# Patient Record
Sex: Male | Born: 1955 | Race: White | Hispanic: No | Marital: Married | Smoking: Former smoker
Health system: Southern US, Community
[De-identification: ages and names within clinical notes are randomized; demographics above are authoritative.]

## PROBLEM LIST (undated history)

## (undated) DIAGNOSIS — F988 Other specified behavioral and emotional disorders with onset usually occurring in childhood and adolescence: Secondary | ICD-10-CM

## (undated) DIAGNOSIS — F909 Attention-deficit hyperactivity disorder, unspecified type: Secondary | ICD-10-CM

## (undated) DIAGNOSIS — E119 Type 2 diabetes mellitus without complications: Secondary | ICD-10-CM

## (undated) DIAGNOSIS — H3320 Serous retinal detachment, unspecified eye: Secondary | ICD-10-CM

## (undated) DIAGNOSIS — E7800 Pure hypercholesterolemia, unspecified: Secondary | ICD-10-CM

## (undated) DIAGNOSIS — D649 Anemia, unspecified: Secondary | ICD-10-CM

## (undated) DIAGNOSIS — G629 Polyneuropathy, unspecified: Secondary | ICD-10-CM

## (undated) DIAGNOSIS — K219 Gastro-esophageal reflux disease without esophagitis: Secondary | ICD-10-CM

## (undated) DIAGNOSIS — M170 Bilateral primary osteoarthritis of knee: Secondary | ICD-10-CM

## (undated) DIAGNOSIS — C44619 Basal cell carcinoma of skin of left upper limb, including shoulder: Secondary | ICD-10-CM

## (undated) DIAGNOSIS — R413 Other amnesia: Secondary | ICD-10-CM

## (undated) DIAGNOSIS — G4730 Sleep apnea, unspecified: Secondary | ICD-10-CM

## (undated) DIAGNOSIS — K5792 Diverticulitis of intestine, part unspecified, without perforation or abscess without bleeding: Secondary | ICD-10-CM

## (undated) DIAGNOSIS — I10 Essential (primary) hypertension: Secondary | ICD-10-CM

## (undated) HISTORY — PX: ANKLE FRACTURE SURGERY: SHX122

## (undated) HISTORY — PX: SEPTOPLASTY: SUR1290

## (undated) HISTORY — PX: COLONOSCOPY: SHX174

## (undated) HISTORY — DX: Other amnesia: R41.3

## (undated) HISTORY — PX: FRACTURE SURGERY: SHX138

## (undated) HISTORY — PX: BASAL CELL CARCINOMA EXCISION: SHX1214

## (undated) HISTORY — PX: JOINT REPLACEMENT: SHX530

## (undated) HISTORY — DX: Other specified behavioral and emotional disorders with onset usually occurring in childhood and adolescence: F98.8

---

## 1998-12-26 ENCOUNTER — Ambulatory Visit: Admission: RE | Admit: 1998-12-26 | Discharge: 1998-12-26 | Payer: Self-pay | Admitting: Otolaryngology

## 2002-02-22 ENCOUNTER — Encounter (INDEPENDENT_AMBULATORY_CARE_PROVIDER_SITE_OTHER): Payer: Self-pay | Admitting: Specialist

## 2002-02-22 ENCOUNTER — Ambulatory Visit (HOSPITAL_COMMUNITY): Admission: RE | Admit: 2002-02-22 | Discharge: 2002-02-22 | Payer: Self-pay | Admitting: Gastroenterology

## 2003-11-28 ENCOUNTER — Ambulatory Visit (HOSPITAL_COMMUNITY): Admission: RE | Admit: 2003-11-28 | Discharge: 2003-11-28 | Payer: Self-pay | Admitting: Family Medicine

## 2003-12-28 ENCOUNTER — Encounter: Admission: RE | Admit: 2003-12-28 | Discharge: 2003-12-28 | Payer: Self-pay | Admitting: Orthopedic Surgery

## 2005-12-12 ENCOUNTER — Encounter (INDEPENDENT_AMBULATORY_CARE_PROVIDER_SITE_OTHER): Payer: Self-pay | Admitting: Specialist

## 2005-12-12 ENCOUNTER — Ambulatory Visit (HOSPITAL_BASED_OUTPATIENT_CLINIC_OR_DEPARTMENT_OTHER): Admission: RE | Admit: 2005-12-12 | Discharge: 2005-12-12 | Payer: Self-pay | Admitting: General Practice

## 2006-06-19 ENCOUNTER — Ambulatory Visit (HOSPITAL_BASED_OUTPATIENT_CLINIC_OR_DEPARTMENT_OTHER): Admission: RE | Admit: 2006-06-19 | Discharge: 2006-06-19 | Payer: Self-pay | Admitting: Surgery

## 2006-06-19 ENCOUNTER — Encounter (INDEPENDENT_AMBULATORY_CARE_PROVIDER_SITE_OTHER): Payer: Self-pay | Admitting: Specialist

## 2008-02-29 ENCOUNTER — Ambulatory Visit (HOSPITAL_COMMUNITY): Admission: RE | Admit: 2008-02-29 | Discharge: 2008-02-29 | Payer: Self-pay | Admitting: Orthopedic Surgery

## 2010-02-07 ENCOUNTER — Encounter: Admission: RE | Admit: 2010-02-07 | Discharge: 2010-05-08 | Payer: Self-pay | Admitting: Family Medicine

## 2010-07-19 ENCOUNTER — Ambulatory Visit: Payer: Self-pay | Admitting: Psychology

## 2011-04-05 NOTE — Procedures (Signed)
[place]  Patient:    Jeff Edwards, Jeff Edwards Visit Number: [id number] MRN: [id number]          Service Type: END Location: ENDO Attending Physician:  Rich Brave Dictated by:   Florencia Reasons, M.D. Proc. Date: 02/22/02 Admit Date:  02/22/2002 Discharge Date: 02/22/2002   CC:         Lillia Carmel, M.D.   Procedure Report  PROCEDURE:  Colonoscopy with biopsy.  INDICATION:  This is a 55 year old gentleman with a family history of colon cancer in his mother when she was in her 66s.  Colonoscopy about five years ago showed a hyperplastic polyp.  FINDINGS:  Sigmoid diverticulosis.  Two Louth polyps.  DESCRIPTION OF PROCEDURE:  The nature, purpose, and risks of the procedure were familiar to the patient from prior examination, and he provided written consent.  Sedation was fentanyl 100 mcg and Versed 10 mg IV without arrhythmias or desaturation.  Digital exam of the rectum was unremarkable. The Olympus adjustable-tension pediatric video colonoscope was advanced without too much difficulty to the cecum, and pullback was then performed. The cecum was identified by visualization of the appendiceal orifice.  We did have to apply some external abdominal compression and also turn the patient into the supine position to help control looping during advancement of the scope.  Pullback was performed.  The quality of the prep was very good, and it is felt that essentially all areas were well-seen.  There was a moderate amount of sigmoid diverticulosis.  In the ascending colon there was a 2-3 mm slightly erythematous sessile polyp removed by a couple of cold biopsies.  At 25 cm there was what appeared to be a Dugo sessile hyperplastic-appearing polyp, which I biopsied x1.  After closer inspection I wondered if it could possibly represent an inverted diverticulum, although after the biopsy the lesion seemed to conform into a more classic  semipedunculated polypoid configuration.  In any event, probing the lesion with the biopsy forceps did not cause it to evert into a nice, standard diverticulum, but rather led to some fold formation.  Given the uncertain character of this sigmoid polypoid lesion and given the fact that it had been biopsied once, I elected to leave it alone with respect to further evaluation or biopsies.  Reinspection of the rectosigmoid as well as retroflexion in the rectum was unremarkable.  The patient tolerated the procedure well, and there were no apparent complications.  IMPRESSION:  Roesler polyp in the ascending colon and probable Ferrick polyp at 25 cm, each biopsied.  PLAN:  Await pathology. Dictated by:   Florencia Reasons, M.D. Attending Physician:  Rich Brave DD:  02/22/02 TD:  02/23/02 Job: [id number]

## 2011-04-05 NOTE — Op Note (Signed)
NAME:  Jeff Edwards, Jeff Edwards               ACCOUNT NO.:  [id number]   MEDICAL RECORD NO.:  [id number]          PATIENT TYPE:  AMB   LOCATION:  DSC                          FACILITY:  [facility]   PHYSICIAN:  Currie Paris, M.D.DATE OF BIRTH:  12-24-1955   DATE OF PROCEDURE:  06/19/2006  DATE OF DISCHARGE:                                 OPERATIVE REPORT   PREOPERATIVE DIAGNOSIS:  Basal cell carcinoma, left antecubital fossa.   POSTOPERATIVE DIAGNOSIS:  Basal cell carcinoma, left antecubital fossa.   OPERATION:  Excision, basal cell carcinoma, antecubital fossa.   SURGEON:  Dr. Jamey Ripa.   ANESTHESIA:  Local.   CLINICAL HISTORY:  [name] has had a biopsy of the Gallicchio lesion in the  antecubital fossa which showed a basal cell carcinoma.  After evaluation by  a dermatologist, it appeared to be the only lesion, and we brought him to  the minor procedure room for wide excision.   DESCRIPTION OF PROCEDURE:  In the minor procedure room, the lesion was  identified.  We confirmed that with plans for removal of that under local.   The area was infiltrated 1% Xylocaine with epinephrine.  I waited about 10  minutes.  It was then prepped with Betadine.   I had good anesthesia.  I made an elliptical incision so that we got a 3- to  4-mm margin at a minimal in all directions.  The incision was made  vertically.  I was at the very lateral edge of the antecubital fossa.  I  then extended the incision at the top and bottom at an angle to get  something of a Z-plasty for closure so that we did not have a straight  incision going across the joint.  The incision was then closed with some  interrupted 3-0 Vicryl plus 5-0 Prolene.   The patient tolerated the procedure well.  There were no complications.      Currie Paris, M.D.  Electronically Signed     CJS/MEDQ  D:  06/20/2006  T:  06/20/2006  Job:  [id number]

## 2011-07-25 ENCOUNTER — Other Ambulatory Visit (HOSPITAL_COMMUNITY): Payer: Self-pay | Admitting: Orthopedic Surgery

## 2011-07-25 DIAGNOSIS — M2390 Unspecified internal derangement of unspecified knee: Secondary | ICD-10-CM

## 2011-08-05 ENCOUNTER — Ambulatory Visit (HOSPITAL_COMMUNITY)
Admission: RE | Admit: 2011-08-05 | Discharge: 2011-08-05 | Disposition: A | Discharge status: Home / Self Care | Payer: 59 | Source: Ambulatory Visit | Attending: Orthopedic Surgery | Admitting: Orthopedic Surgery

## 2011-08-05 DIAGNOSIS — M2390 Unspecified internal derangement of unspecified knee: Secondary | ICD-10-CM

## 2011-08-05 DIAGNOSIS — IMO0002 Reserved for concepts with insufficient information to code with codable children: Secondary | ICD-10-CM | POA: Insufficient documentation

## 2011-08-05 DIAGNOSIS — X58XXXA Exposure to other specified factors, initial encounter: Secondary | ICD-10-CM | POA: Insufficient documentation

## 2011-08-05 DIAGNOSIS — M25569 Pain in unspecified knee: Secondary | ICD-10-CM | POA: Insufficient documentation

## 2012-05-26 ENCOUNTER — Other Ambulatory Visit (HOSPITAL_COMMUNITY): Payer: Self-pay | Admitting: Orthopedic Surgery

## 2012-06-29 ENCOUNTER — Encounter (HOSPITAL_COMMUNITY)
Admission: RE | Admit: 2012-06-29 | Discharge: 2012-06-29 | Disposition: A | Discharge status: Home / Self Care | Payer: 59 | Source: Ambulatory Visit | Attending: Orthopedic Surgery | Admitting: Orthopedic Surgery

## 2012-06-29 ENCOUNTER — Encounter (HOSPITAL_COMMUNITY): Payer: Self-pay

## 2012-06-29 ENCOUNTER — Encounter (HOSPITAL_COMMUNITY): Payer: Self-pay | Admitting: Pharmacy Technician

## 2012-06-29 ENCOUNTER — Other Ambulatory Visit: Payer: Self-pay | Admitting: Nephrology

## 2012-06-29 DIAGNOSIS — I10 Essential (primary) hypertension: Secondary | ICD-10-CM

## 2012-06-29 HISTORY — DX: Sleep apnea, unspecified: G47.30

## 2012-06-29 HISTORY — DX: Attention-deficit hyperactivity disorder, unspecified type: F90.9

## 2012-06-29 HISTORY — DX: Essential (primary) hypertension: I10

## 2012-06-29 HISTORY — DX: Gastro-esophageal reflux disease without esophagitis: K21.9

## 2012-06-29 LAB — CBC
HCT: 42.3 % (ref 39.0–52.0)
RDW: 13.4 % (ref 11.5–15.5)
WBC: 9.4 10*3/uL (ref 4.0–10.5)

## 2012-06-29 LAB — COMPREHENSIVE METABOLIC PANEL
ALT: 17 U/L (ref 0–53)
AST: 13 U/L (ref 0–37)
Albumin: 3.7 g/dL (ref 3.5–5.2)
Alkaline Phosphatase: 53 U/L (ref 39–117)
BUN: 14 mg/dL (ref 6–23)
Chloride: 103 mEq/L (ref 96–112)
Potassium: 3.9 mEq/L (ref 3.5–5.1)
Sodium: 142 mEq/L (ref 135–145)
Total Bilirubin: 0.3 mg/dL (ref 0.3–1.2)
Total Protein: 6.9 g/dL (ref 6.0–8.3)

## 2012-06-29 LAB — PROTIME-INR: Prothrombin Time: 14 seconds (ref 11.6–15.2)

## 2012-06-29 LAB — SURGICAL PCR SCREEN: Staphylococcus aureus: NEGATIVE

## 2012-06-29 LAB — APTT: aPTT: 31 seconds (ref 24–37)

## 2012-06-29 NOTE — Pre-Procedure Instructions (Addendum)
[address] Jeff Edwards  06/29/2012   Your procedure is scheduled on:  Wednesday July 08, 2012  Report to [facility] Short Stay [facility] at 9:30 AM.  Call this number if you have problems the morning of surgery: [phone]   Remember:   Do not eat food or drink:After Midnight.      Take these medicines the morning of surgery with A SIP OF WATER: prilosec, methylphenidate, hydrocodone    Do not wear jewelry, make-up or nail polish.  Do not wear lotions, powders, or perfumes. You may wear deodorant.  Do not shave 48 hours prior to surgery. Men may shave face and neck.  Do not bring valuables to the hospital.  Contacts, dentures or bridgework may not be worn into surgery.  Leave suitcase in the car. After surgery it may be brought to your room.  For patients admitted to the hospital, checkout time is 11:00 AM the day of discharge.   Patients discharged the day of surgery will not be allowed to drive home.  Name and phone number of your driver:  Special Instructions: CHG Shower Use Special Wash: 1/2 bottle night before surgery and 1/2 bottle morning of surgery.   Please read over the following fact sheets that you were given: Pain Booklet, Coughing and Deep Breathing, MRSA Information and Surgical Site Infection Prevention

## 2012-07-09 MED ORDER — CLINDAMYCIN PHOSPHATE 900 MG/50ML IV SOLN
900.0000 mg | INTRAVENOUS | Status: AC
  Administered 2012-07-10: 900 mg via INTRAVENOUS
  Filled 2012-07-09: qty 50

## 2012-07-09 NOTE — Progress Notes (Signed)
Pt returned call to [facility]..he will arrive at 0530 due to time change.. On 07/10/2012

## 2012-07-09 NOTE — Progress Notes (Addendum)
Left message for patient to arrive at 0530 for surgery 07/10/12. I did leave a message for patient to call back so we would know he got the message. The patient did have his name on message.

## 2012-07-10 ENCOUNTER — Encounter (HOSPITAL_COMMUNITY): Payer: Self-pay | Admitting: Certified Registered"

## 2012-07-10 ENCOUNTER — Ambulatory Visit (HOSPITAL_COMMUNITY)
Admission: RE | Admit: 2012-07-10 | Discharge: 2012-07-10 | Disposition: A | Discharge status: Home / Self Care | Payer: 59 | Source: Ambulatory Visit | Attending: Orthopedic Surgery | Admitting: Orthopedic Surgery

## 2012-07-10 ENCOUNTER — Encounter (HOSPITAL_COMMUNITY)
Admission: RE | Disposition: A | Discharge status: Home / Self Care | Payer: Self-pay | Source: Ambulatory Visit | Attending: Orthopedic Surgery

## 2012-07-10 ENCOUNTER — Encounter (HOSPITAL_COMMUNITY): Payer: Self-pay | Admitting: *Deleted

## 2012-07-10 ENCOUNTER — Ambulatory Visit (HOSPITAL_COMMUNITY): Payer: 59 | Admitting: Certified Registered"

## 2012-07-10 DIAGNOSIS — K219 Gastro-esophageal reflux disease without esophagitis: Secondary | ICD-10-CM | POA: Insufficient documentation

## 2012-07-10 DIAGNOSIS — I10 Essential (primary) hypertension: Secondary | ICD-10-CM | POA: Insufficient documentation

## 2012-07-10 DIAGNOSIS — Z01812 Encounter for preprocedural laboratory examination: Secondary | ICD-10-CM | POA: Insufficient documentation

## 2012-07-10 DIAGNOSIS — M1710 Unilateral primary osteoarthritis, unspecified knee: Secondary | ICD-10-CM

## 2012-07-10 DIAGNOSIS — G4730 Sleep apnea, unspecified: Secondary | ICD-10-CM | POA: Insufficient documentation

## 2012-07-10 DIAGNOSIS — E119 Type 2 diabetes mellitus without complications: Secondary | ICD-10-CM | POA: Insufficient documentation

## 2012-07-10 DIAGNOSIS — S83289A Other tear of lateral meniscus, current injury, unspecified knee, initial encounter: Secondary | ICD-10-CM

## 2012-07-10 DIAGNOSIS — M659 Unspecified synovitis and tenosynovitis, unspecified site: Secondary | ICD-10-CM | POA: Insufficient documentation

## 2012-07-10 DIAGNOSIS — M23302 Other meniscus derangements, unspecified lateral meniscus, unspecified knee: Secondary | ICD-10-CM | POA: Insufficient documentation

## 2012-07-10 DIAGNOSIS — M23305 Other meniscus derangements, unspecified medial meniscus, unspecified knee: Secondary | ICD-10-CM | POA: Insufficient documentation

## 2012-07-10 DIAGNOSIS — Z01810 Encounter for preprocedural cardiovascular examination: Secondary | ICD-10-CM | POA: Insufficient documentation

## 2012-07-10 HISTORY — PX: KNEE ARTHROSCOPY: SHX127

## 2012-07-10 LAB — GLUCOSE, CAPILLARY
Glucose-Capillary: 100 mg/dL — ABNORMAL HIGH (ref 70–99)
Glucose-Capillary: 96 mg/dL (ref 70–99)

## 2012-07-10 SURGERY — ARTHROSCOPY, KNEE
Anesthesia: General | Site: Knee | Laterality: Right | Wound class: Clean

## 2012-07-10 MED ORDER — SODIUM CHLORIDE 0.9 % IR SOLN
Status: DC | PRN
  Administered 2012-07-10 (×2): 3000 mL

## 2012-07-10 MED ORDER — HYDROCODONE-ACETAMINOPHEN 5-500 MG PO TABS: 1.0000 | ORAL_TABLET | Freq: Four times a day (QID) | ORAL | Status: AC | PRN

## 2012-07-10 MED ORDER — LACTATED RINGERS IV SOLN
INTRAVENOUS | Status: DC | PRN
  Administered 2012-07-10: 07:00:00 via INTRAVENOUS

## 2012-07-10 MED ORDER — MIDAZOLAM HCL 5 MG/5ML IJ SOLN
INTRAMUSCULAR | Status: DC | PRN
  Administered 2012-07-10: 2 mg via INTRAVENOUS

## 2012-07-10 MED ORDER — ROPIVACAINE HCL 5 MG/ML IJ SOLN
INTRAMUSCULAR | Status: DC | PRN
  Administered 2012-07-10: 15 mL via EPIDURAL

## 2012-07-10 MED ORDER — LIDOCAINE-EPINEPHRINE (PF) 1.5 %-1:200000 IJ SOLN
INTRAMUSCULAR | Status: DC | PRN
  Administered 2012-07-10: 15 mL

## 2012-07-10 MED ORDER — HYDROMORPHONE HCL PF 1 MG/ML IJ SOLN: 0.2500 mg | INTRAMUSCULAR | Status: DC | PRN

## 2012-07-10 MED ORDER — MEPERIDINE HCL 25 MG/ML IJ SOLN: 6.2500 mg | INTRAMUSCULAR | Status: DC | PRN

## 2012-07-10 MED ORDER — BUPIVACAINE HCL (PF) 0.25 % IJ SOLN
INTRAMUSCULAR | Status: DC | PRN
  Administered 2012-07-10: 30 mL

## 2012-07-10 MED ORDER — FENTANYL CITRATE 0.05 MG/ML IJ SOLN
INTRAMUSCULAR | Status: DC | PRN
  Administered 2012-07-10: 50 ug via INTRAVENOUS
  Administered 2012-07-10: 100 ug via INTRAVENOUS

## 2012-07-10 MED ORDER — BUPIVACAINE HCL (PF) 0.25 % IJ SOLN
INTRAMUSCULAR | Status: AC
  Filled 2012-07-10: qty 30

## 2012-07-10 MED ORDER — PROMETHAZINE HCL 25 MG/ML IJ SOLN: 6.2500 mg | INTRAMUSCULAR | Status: DC | PRN

## 2012-07-10 MED ORDER — OXYCODONE-ACETAMINOPHEN 5-325 MG PO TABS: 1.0000 | ORAL_TABLET | ORAL | Status: AC | PRN

## 2012-07-10 MED ORDER — PROPOFOL 10 MG/ML IV EMUL
INTRAVENOUS | Status: DC | PRN
  Administered 2012-07-10: 100 ug/kg/min via INTRAVENOUS

## 2012-07-10 SURGICAL SUPPLY — 37 items
BLADE CUDA 5.5 (BLADE) IMPLANT
BLADE GREAT WHITE 4.2 (BLADE) IMPLANT
BNDG COHESIVE 6X5 TAN STRL LF (GAUZE/BANDAGES/DRESSINGS) ×2 IMPLANT
BUR OVAL 6.0 (BURR) IMPLANT
CLOTH BEACON ORANGE TIMEOUT ST (SAFETY) ×2 IMPLANT
COVER SURGICAL LIGHT HANDLE (MISCELLANEOUS) ×2 IMPLANT
CUFF TOURNIQUET SINGLE 34IN LL (TOURNIQUET CUFF) IMPLANT
CUFF TOURNIQUET SINGLE 44IN (TOURNIQUET CUFF) IMPLANT
DRAPE ARTHROSCOPY W/POUCH 114 (DRAPES) ×2 IMPLANT
DRAPE U-SHAPE 47X51 STRL (DRAPES) ×2 IMPLANT
DRESSING ADAPTIC 1/2  N-ADH (PACKING) ×1 IMPLANT
DRSG EMULSION OIL 3X3 NADH (GAUZE/BANDAGES/DRESSINGS) ×2 IMPLANT
DRSG PAD ABDOMINAL 8X10 ST (GAUZE/BANDAGES/DRESSINGS) ×2 IMPLANT
DURAPREP 26ML APPLICATOR (WOUND CARE) ×2 IMPLANT
GLOVE SURG ORTHO 9.0 STRL STRW (GLOVE) ×2 IMPLANT
GOWN PREVENTION PLUS XLARGE (GOWN DISPOSABLE) ×2 IMPLANT
GOWN SRG XL XLNG 56XLVL 4 (GOWN DISPOSABLE) ×2 IMPLANT
GOWN STRL NON-REIN XL XLG LVL4 (GOWN DISPOSABLE) ×4
KIT BASIN OR (CUSTOM PROCEDURE TRAY) ×2 IMPLANT
KIT ROOM TURNOVER OR (KITS) ×2 IMPLANT
MANIFOLD NEPTUNE II (INSTRUMENTS) ×2 IMPLANT
NDL 18GX1X1/2 (RX/OR ONLY) (NEEDLE) ×1 IMPLANT
NEEDLE 18GX1X1/2 (RX/OR ONLY) (NEEDLE) ×2 IMPLANT
PACK ARTHROSCOPY DSU (CUSTOM PROCEDURE TRAY) ×2 IMPLANT
PAD ARMBOARD 7.5X6 YLW CONV (MISCELLANEOUS) ×4 IMPLANT
PADDING CAST ABS 6INX4YD NS (CAST SUPPLIES) ×1
PADDING CAST ABS COTTON 6X4 NS (CAST SUPPLIES) IMPLANT
PADDING CAST COTTON 6X4 STRL (CAST SUPPLIES) ×2 IMPLANT
SET ARTHROSCOPY TUBING (MISCELLANEOUS) ×2
SET ARTHROSCOPY TUBING LN (MISCELLANEOUS) ×1 IMPLANT
SPONGE GAUZE 4X4 12PLY (GAUZE/BANDAGES/DRESSINGS) ×2 IMPLANT
SUT ETHILON 4 0 PS 2 18 (SUTURE) ×2 IMPLANT
SYR 20CC LL (SYRINGE) ×2 IMPLANT
TOWEL OR 17X24 6PK STRL BLUE (TOWEL DISPOSABLE) ×4 IMPLANT
TUBE CONNECTING 12X1/4 (SUCTIONS) ×2 IMPLANT
WAND 90 DEG TURBOVAC W/CORD (SURGICAL WAND) ×1 IMPLANT
WATER STERILE IRR 1000ML POUR (IV SOLUTION) ×2 IMPLANT

## 2012-07-10 NOTE — Progress Notes (Signed)
cbg 96 

## 2012-07-10 NOTE — H&P (Signed)
Jeff Edwards is an 56 y.o. male.   Chief Complaint:  Osteoarthritis with mechanical symptoms right knee HPI: Patient is a 56 year old gentleman who has had pain with activities daily living with his right knee with catching locking giving way.  Past Medical History  Diagnosis Date  . Hypertension   . Diabetes mellitus   . ADHD (attention deficit hyperactivity disorder)   . Sleep apnea     cpap does not use (study done 10+ yrs)  . GERD (gastroesophageal reflux disease)   . Cancer     basal cell carcinoma     Past Surgical History  Procedure Date  . Basal cell carcinoma excision   . Ankle fracture surgery     broken and reset   age 8s    No family history on file. Social History:  reports that he has never smoked. He does not have any smokeless tobacco history on file. He reports that he does not drink alcohol or use illicit drugs.  Allergies:  Allergies  Allergen Reactions  . Penicillins Other (See Comments)    Passed out as a child    Medications Prior to Admission  Medication Sig Dispense Refill  . aspirin EC 81 MG tablet Take 81 mg by mouth daily.      . B Complex-C (SUPER B COMPLEX PO) Take 1 tablet by mouth daily.      . cetirizine (ZYRTEC) 10 MG tablet Take 10 mg by mouth every other day.      Marland Kitchen HYDROcodone-acetaminophen (NORCO/VICODIN) 5-325 MG per tablet Take 1 tablet by mouth every 6 (six) hours as needed. For pain      . lisinopril (PRINIVIL,ZESTRIL) 5 MG tablet Take 5 mg by mouth daily.      . metFORMIN (GLUCOPHAGE) 500 MG tablet Take 500 mg by mouth 2 (two) times daily with a meal.      . methylphenidate (RITALIN) 5 MG tablet Take 5 mg by mouth 2 (two) times daily.      . Multiple Vitamin (MULTIVITAMIN WITH MINERALS) TABS Take 1 tablet by mouth daily.      Marland Kitchen omeprazole (PRILOSEC) 40 MG capsule Take 40 mg by mouth daily.      . simvastatin (ZOCOR) 20 MG tablet Take 20 mg by mouth every evening.        No results found for this or any previous visit (from the  past 48 hour(s)). No results found.  Review of Systems  All other systems reviewed and are negative.    There were no vitals taken for this visit. Physical Exam  On examination patient has tenderness to palpation of the medial and lateral joint lines collaterals and cruciates are stable.  Assessment/Plan Assessment osteoarthritis right knee with mechanical symptoms. Plan: Will plan for right knee arthroscopy with debridement. Risks and benefits were discussed including infection neurovascular injury persistent pain need for additional surgery. Patient states he understands and wished to proceed at this time.  Erickson Yamashiro V 07/10/2012, 6:13 AM

## 2012-07-10 NOTE — Op Note (Signed)
OPERATIVE REPORT  DATE OF SURGERY: 07/10/2012  PATIENT:  Jeff Edwards,  56 y.o. male  PRE-OPERATIVE DIAGNOSIS:  Medial meniscal tear  POST-OPERATIVE DIAGNOSIS:  Medial meniscal tear, lateral meniscal tear. #2 osteochondral defect medial femoral condyle trochlea and patella  PROCEDURE:  Procedure(s): ARTHROSCOPY KNEE. Partial medial and lateral meniscectomy. Abrasion chondroplasty medial femoral condyle patella and trochlea. Removal of loose bodies greater than 10 mm in diameter.  SURGEON:  Surgeon(s): Nadara Mustard, MD  ANESTHESIA:   regional  EBL:  Minimal ML  SPECIMEN:  No Specimen  TOURNIQUET:  * No tourniquets in log *  PROCEDURE DETAILS: Patient is a 56 year old gentleman with mechanical symptoms of his right knee. He has failed conservative treatment he has had temporary relief with steroid injections has mechanical symptoms with catching locking giving riding presents at this time for arthroscopic intervention. Risks and benefits were discussed including infection neurovascular injury persistent pain DVT need for additional surgery. Patient states he understands and wished to proceed at this time. Description of procedure patient brought to the OR after undergoing a knee block. After adequate levels of anesthesia were obtained patient's right lower extremity was prepped using DuraPrep and draped into a sterile field. The scope was inserted from the anterior lateral portal and anterior medial working portal was established. Visualization showed a large degenerative radial tear posterior lateral corner of the medial meniscus. The shaver and vapor wand were used for debridement. Patient had large loose bodies from the osteochondral defect of medial femoral condyle and the medial femoral condyle was debrided with the shaver and the ring curette back to bleeding viable subchondral bone and the loose bodies were removed. Examination notch showed an intact anterior cruciate ligament and  PCL. Examination the lateral joint line and a figure 4 position showed a degenerative tear along the edge of the lateral meniscus and this was debrided with the shaver and vapor wand. The articular cartilage was in good condition the lateral joint line. Patient has significant evidence synovitis and a synovectomy was performed. Examination of patellofemoral joint also showed loose bodies from the trochlea and this was debrided with the shaver. Patient enlargement medial plica which was debrided. The vapor wand was used for hemostasis. A survey of the medial lateral gutters as well as the suprapatellar pouch and mediolateral joint line showed no further loose bodies. The isthmus removed the portals were closed using 4-0 nylon. The wound was covered with Adaptic orthopedic sponges AB dressing Kerlix and Coban. The joint was infused a total of 20 cc of quarter percent Marcaine plain. Patient was taken to the PACU in stable condition.  PLAN OF CARE: Discharge to home after PACU  PATIENT DISPOSITION:  PACU - hemodynamically stable.   Nadara Mustard, MD 07/10/2012 8:38 AM

## 2012-07-10 NOTE — Anesthesia Postprocedure Evaluation (Signed)
  Anesthesia Post-op Note  Patient: Jeff Edwards  Procedure(s) Performed: Procedure(s) (LRB): ARTHROSCOPY KNEE (Right)  Patient Location: PACU  Anesthesia Type: Regional  Level of Consciousness: awake  Airway and Oxygen Therapy: Patient Spontanous Breathing  Post-op Pain: none  Post-op Assessment: Post-op Vital signs reviewed  Post-op Vital Signs: stable  Complications: No apparent anesthesia complications

## 2012-07-10 NOTE — Progress Notes (Signed)
Orthopedic Tech Progress Note Patient Details:  Jeff Edwards Feb 16, 1956 [id number] Crutches delivered and set, patient unable to get up at this time for proper fitting. Instruction given. Ortho Devices Type of Ortho Device: Crutches Ortho Device/Splint Location: Crutches given Ortho Device/Splint Interventions: Application   Asia R Thompson 07/10/2012, 9:42 AM

## 2012-07-10 NOTE — Anesthesia Preprocedure Evaluation (Addendum)
Anesthesia Evaluation  Patient identified by MRN, date of birth, ID band Patient awake    Reviewed: Allergy & Precautions, H&P , NPO status , Patient's Chart, lab work & pertinent test results  Airway Mallampati: II TM Distance: >3 FB Neck ROM: Full    Dental  (+) Teeth Intact   Pulmonary sleep apnea ,  breath sounds clear to auscultation        Cardiovascular hypertension, Pt. on medications Rhythm:Regular Rate:Normal     Neuro/Psych    GI/Hepatic   Endo/Other  Well Controlled, Type 2, Oral Hypoglycemic Agents  Renal/GU      Musculoskeletal   Abdominal   Peds  Hematology   Anesthesia Other Findings   Reproductive/Obstetrics                          Anesthesia Physical Anesthesia Plan  ASA: II  Anesthesia Plan: Regional   Post-op Pain Management:    Induction: Intravenous  Airway Management Planned: Simple Face Mask  Additional Equipment:   Intra-op Plan:   Post-operative Plan:   Informed Consent: I have reviewed the patients History and Physical, chart, labs and discussed the procedure including the risks, benefits and alternatives for the proposed anesthesia with the patient or authorized representative who has indicated his/her understanding and acceptance.   Dental advisory given  Plan Discussed with: CRNA, Surgeon and Anesthesiologist  Anesthesia Plan Comments:        Anesthesia Quick Evaluation

## 2012-07-10 NOTE — Anesthesia Procedure Notes (Signed)
Anesthesia Regional Block:    Pre-Anesthetic Checklist: ,, timeout performed, Correct Patient, Correct Site, Correct Laterality, Correct Procedure, Correct Position, site marked, Risks and benefits discussed, Surgical consent,  Laterality: Right and Upper  Prep: Dura Prep        Narrative:  Start time: 07/10/2012 6:50 AM  Performed by: Personally  Anesthesiologist: T Evalise Abruzzese  Additional Notes: Knee Block performed, sterile prep and drape. 2 inferior portals blocked, for knee scope. Tolerated well

## 2012-07-10 NOTE — Preoperative (Signed)
Beta Blockers   Reason not to administer Beta Blockers:Not Applicable 

## 2012-07-10 NOTE — Transfer of Care (Signed)
Immediate Anesthesia Transfer of Care Note  Patient: Jeff Edwards  Procedure(s) Performed: Procedure(s) (LRB): ARTHROSCOPY KNEE (Right)  Patient Location: PACU  Anesthesia Type: MAC  Level of Consciousness: awake, alert  and oriented  Airway & Oxygen Therapy: Patient Spontanous Breathing and Patient connected to nasal cannula oxygen  Post-op Assessment: Report given to PACU RN, Post -op Vital signs reviewed and stable and Patient moving all extremities  Post vital signs: Reviewed and stable  Complications: No apparent anesthesia complications

## 2012-07-14 ENCOUNTER — Encounter (HOSPITAL_COMMUNITY): Payer: Self-pay | Admitting: Orthopedic Surgery

## 2012-07-16 ENCOUNTER — Encounter (HOSPITAL_COMMUNITY): Payer: Self-pay

## 2012-08-05 ENCOUNTER — Ambulatory Visit
Admission: RE | Admit: 2012-08-05 | Discharge: 2012-08-05 | Disposition: A | Discharge status: Home / Self Care | Payer: 59 | Source: Ambulatory Visit | Attending: Nephrology | Admitting: Nephrology

## 2012-08-05 DIAGNOSIS — I10 Essential (primary) hypertension: Secondary | ICD-10-CM

## 2012-08-17 ENCOUNTER — Other Ambulatory Visit: Payer: Self-pay | Admitting: Gastroenterology

## 2012-11-16 ENCOUNTER — Ambulatory Visit (INDEPENDENT_AMBULATORY_CARE_PROVIDER_SITE_OTHER): Payer: Self-pay | Admitting: Family Medicine

## 2012-11-16 DIAGNOSIS — E119 Type 2 diabetes mellitus without complications: Secondary | ICD-10-CM

## 2012-11-16 NOTE — Progress Notes (Signed)
Patient presents today for 3 month DM follow-up as part of the employer-sponsored Link to Wellness program. Medications and glucose readings have been reviewed. I have also discussed with patient lifestyle interventions such as healthy eating choices and physical activity. Details of this visit can be found in [facility] documenting program available through [facility]). Patient has set a series of personal goals and will follow-up in 3 months for further review of DM.

## 2012-11-25 ENCOUNTER — Encounter: Payer: Self-pay | Admitting: Family Medicine

## 2012-11-25 NOTE — Progress Notes (Signed)
Patient ID: Jeff Edwards, male   DOB: 1955/12/07, 57 y.o.   MRN: [id number] Reviewed: Agree with documentation and management.

## 2012-12-21 ENCOUNTER — Encounter (INDEPENDENT_AMBULATORY_CARE_PROVIDER_SITE_OTHER): Payer: 59 | Admitting: Ophthalmology

## 2012-12-21 DIAGNOSIS — H35719 Central serous chorioretinopathy, unspecified eye: Secondary | ICD-10-CM

## 2012-12-21 DIAGNOSIS — H35039 Hypertensive retinopathy, unspecified eye: Secondary | ICD-10-CM

## 2012-12-21 DIAGNOSIS — H2510 Age-related nuclear cataract, unspecified eye: Secondary | ICD-10-CM

## 2012-12-21 DIAGNOSIS — H43819 Vitreous degeneration, unspecified eye: Secondary | ICD-10-CM

## 2012-12-21 DIAGNOSIS — I10 Essential (primary) hypertension: Secondary | ICD-10-CM

## 2013-01-18 ENCOUNTER — Encounter (INDEPENDENT_AMBULATORY_CARE_PROVIDER_SITE_OTHER): Payer: 59 | Admitting: Ophthalmology

## 2013-01-18 DIAGNOSIS — H3530 Unspecified macular degeneration: Secondary | ICD-10-CM

## 2013-01-18 DIAGNOSIS — H35729 Serous detachment of retinal pigment epithelium, unspecified eye: Secondary | ICD-10-CM

## 2013-01-18 DIAGNOSIS — H35719 Central serous chorioretinopathy, unspecified eye: Secondary | ICD-10-CM

## 2013-01-18 DIAGNOSIS — H43819 Vitreous degeneration, unspecified eye: Secondary | ICD-10-CM

## 2013-01-18 DIAGNOSIS — H2510 Age-related nuclear cataract, unspecified eye: Secondary | ICD-10-CM

## 2013-03-01 ENCOUNTER — Ambulatory Visit (INDEPENDENT_AMBULATORY_CARE_PROVIDER_SITE_OTHER): Payer: Self-pay | Admitting: Family Medicine

## 2013-03-01 VITALS — BP 131/88 | HR 79 | Ht 70.0 in | Wt 229.8 lb

## 2013-03-01 DIAGNOSIS — E119 Type 2 diabetes mellitus without complications: Secondary | ICD-10-CM

## 2013-03-01 NOTE — Progress Notes (Signed)
Subjective:  Patient presents for a 3 month follow up appointment for an employer sponsored wellness program, [employer] to Wellness. 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 He states that he started seeing an opthalmologist and his vision has now returned. The cause of the detachment is unknown.   His weight is down about 10 pounds since his last appointment. He attributes this to increased physical activity.   He has no pending appointments with his PCP. At his last visit we checked his A1C and it was 6.7%. He declined an A1C check today.    Disease Assessments:  Diabetes:  Type of Diabetes: Type 2; Year of diagnosis 2010; Sees Diabetes provider 1 time a year; MD managing Diabetes Dr. Prince Rome;  checks feet daily; uses glucometer; takes medications as prescribed; takes an aspirin a day;  hypoglycemia frequency rarely; checks blood glucose once daily;  7 day CBG average 111; 14 day CBG average 121; 30 day CBG average 119; Lowest CBG 83; Highest CBG 169;   Other Diabetes History: He is checking his blood sugar once daily before his afternoon meal (this is fasting since he works third shift). Sometimes when he works he checks twice daily.   He recently had an URTI and his blood sugars were elevated.      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 Outside of work he is walking 3-4 days a week for 2 miles and it takes him 45 minutes. He is also doing more yard work now that the weather has improved. He states that as the  weather warms he will start walking in the morning.   Nutrition- He states that this is getting better. He previously was eating prepackaged meals with lunch. When he got home his BG was in the 80s-90s and he would eat a bowl of cereal. Now he is bringing his own lunch to work and his blood sugar is slightly higher- in the 100s-110s and he is not eating the cereal. He works third shift and he is eating at 5 PM dinner, 1 AM lunch, he is sometimes eating breakfast after he gets off, but not always. He states he is not hungry when he gets home but he is tired. He will often have a Mcclard meal/snack when he wakes up in the afternoon.   24 hour recall- patient is a vegetarian.   Breakfast- bowl of cereal, Honey Bunches of Oats with almond milk;   Lunch-diet pepsi, handful of chex mix  Dinner- black bean burger, cheddar cheese and french fries.   He states that he is still snacking on sweets and salty foods when he is working. He is trying to cut back so that he just has 1 serving daily.   Assessment/Plan: Patient is a 57 year old male with diabetes who is here for a 3 month follow up appointment. Since his last visit with me he has lost 10 pounds which was one of his goals from his last appointment. He attributes this weight loss to increased physical activity.  I congratulated patient on his accomplishment and encouraged him to continue losing weight. He wants to lose more weight. I advised him to increase physical activity and to decrease his intake of sugary snacks while he is working. Apparently his wife has been buying more doughnuts from a new shop that opened and he has been bringing one to work each night.   Patient is taking an ACE-I, ASA and a statin. Patient is interested in switching to pantoprazole due to the zero copay. I will fax Dr. Raye Sorrow office for the change.   CBGs from his meter are slightly elevated compared to his last visit, but this is likely secondary to his illness. No  hypoglycemia noted and most CBGs are at goal around 100-120.   Because his last A1C was at goal of less than 7%, I deferred A1C testing to next visit. Will follow up with patient in 3 months.    Teaching Goals:  Diabetes Mellitus:  Other Goals for Next Visit-  1. Continue walking four days a week- goal of 150 minutes weekly. 2. On the days that you work, swap out your sugary snack for a piece of sugar free candy or a piece of fruit.   Next appointment is Friday July 18th at 8 AM.

## 2013-03-11 NOTE — Progress Notes (Signed)
Patient ID: Jeff Edwards, male   DOB: 1956-07-16, 57 y.o.   MRN: [id number] ATTENDING PHYSICIAN NOTE: I have reviewed the chart and agree with the plan as detailed above. Denny Levy MD Pager [phone]

## 2013-03-29 ENCOUNTER — Encounter (INDEPENDENT_AMBULATORY_CARE_PROVIDER_SITE_OTHER): Payer: Commercial Managed Care - PPO | Admitting: Ophthalmology

## 2013-03-29 DIAGNOSIS — H3530 Unspecified macular degeneration: Secondary | ICD-10-CM

## 2013-03-29 DIAGNOSIS — H2510 Age-related nuclear cataract, unspecified eye: Secondary | ICD-10-CM

## 2013-03-29 DIAGNOSIS — H35039 Hypertensive retinopathy, unspecified eye: Secondary | ICD-10-CM

## 2013-03-29 DIAGNOSIS — H43819 Vitreous degeneration, unspecified eye: Secondary | ICD-10-CM

## 2013-03-29 DIAGNOSIS — I10 Essential (primary) hypertension: Secondary | ICD-10-CM

## 2013-03-29 DIAGNOSIS — H35719 Central serous chorioretinopathy, unspecified eye: Secondary | ICD-10-CM

## 2013-04-06 ENCOUNTER — Ambulatory Visit: Payer: Commercial Managed Care - PPO | Admitting: Neurology

## 2013-04-06 ENCOUNTER — Telehealth: Payer: Self-pay

## 2013-04-06 NOTE — Telephone Encounter (Signed)
Patient is sch. In June with Dr.Yan he will need a refill of Metformin before then.

## 2013-04-06 NOTE — Telephone Encounter (Signed)
We do not prescribe Metformin.  This is a medication prescribed by PCP for Diabetes.  I called the patient. Got no answer.  Left message.

## 2013-04-07 ENCOUNTER — Other Ambulatory Visit: Payer: Self-pay

## 2013-04-07 MED ORDER — METHYLPHENIDATE HCL 10 MG PO TABS: 10.0000 mg | ORAL_TABLET | Freq: Two times a day (BID) | ORAL | Status: DC

## 2013-04-07 NOTE — Telephone Encounter (Signed)
Patient called back.  Again asked that we fill Metformin.  I explained that is something that his PCP should prescribe.  He said Dr Sandria Manly always filled it for him.  I have reviewed his chart, and we have not filled Metformin.  Patient actually needs Methylphenidate and thought it was Metformin.  Former [facility] patient with transfer of care to Dr Terrace Arabia

## 2013-04-23 ENCOUNTER — Encounter: Payer: Self-pay | Admitting: Neurology

## 2013-04-23 ENCOUNTER — Ambulatory Visit (INDEPENDENT_AMBULATORY_CARE_PROVIDER_SITE_OTHER): Payer: Commercial Managed Care - PPO | Admitting: Neurology

## 2013-04-23 VITALS — BP 116/77 | HR 70 | Temp 97.7°F | Ht 71.0 in | Wt 227.0 lb

## 2013-04-23 DIAGNOSIS — F988 Other specified behavioral and emotional disorders with onset usually occurring in childhood and adolescence: Secondary | ICD-10-CM

## 2013-04-23 MED ORDER — METHYLPHENIDATE HCL 10 MG PO TABS: 10.0000 mg | ORAL_TABLET | Freq: Two times a day (BID) | ORAL | Status: DC
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
## 2013-06-04 ENCOUNTER — Ambulatory Visit (INDEPENDENT_AMBULATORY_CARE_PROVIDER_SITE_OTHER): Payer: Self-pay | Admitting: Family Medicine

## 2013-06-04 VITALS — BP 124/75 | HR 82 | Ht 70.0 in | Wt 222.0 lb

## 2013-06-04 DIAGNOSIS — E119 Type 2 diabetes mellitus without complications: Secondary | ICD-10-CM

## 2013-06-04 NOTE — Progress Notes (Signed)
Subjective:  Patient presents today for 3 month diabetes follow-up as part of the employer-sponsored Link to Wellness program. Current diabetes regimen includes metformin 500 mg BID. Patient also continues on daily ASA, ACEi, and statin. Most recent MD follow-up was 6 weeks ago with Dr. Prince Rome. He recently started on Voltaren gel for plantar fasciatis.   At his most recent MD follow up he reports that his A1C was 6.5% (with Dr. Prince Rome). He also states that he had a Beller amount of protein in his urine. Dr. Prince Rome was not too concerned with this and will monitor it.    Patient has lost 3 pounds since his last appointment with me. He attributes this to stopping caffeine and diet drinks.    Disease Assessments:  Diabetes:  Type of Diabetes: Type 2; Year of diagnosis 2010; Sees Diabetes provider 1 time a year; MD managing Diabetes Dr. Prince Rome; checks feet daily; uses glucometer; takes medications as prescribed; takes an aspirin a day; hypoglycemia frequency rarely; checks blood glucose once daily;  Highest CBG 116; Lowest CBG 71; Other Diabetes History: He denies hypoglycemia.   High and low are based on patient report- he did not bring his meter to this appointment. He is checking CBG once daily, fasting. He states that his average is about 86-88.     Nutrition-  He states that he has cut out diet sodas- he has replaced that with water. He is drinking more water than he was before. He has also stopped going out to eat too. He still does not eat any meat. He is trying to avoid sugar free candy. He states that he has cut back a lot of sugary snacks and sweet snacks.   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 He is averaging 6 miles a night. He is doing some walking outside of work.    Vital Signs:  06/04/2013 8:00 AM (EST) Blood Pressure 124 / 75 mm/HgBMI 32.5; Height 5 ft 10 in; Pulse Rate 82 bpm; Weight 226.2 lbs    Testing:  Blood Sugar Tests:  Hemoglobin  A1c: 6.5       Assessment/Plan: Patient is a 57 year old male with DM2. Last A1C was 6.5% which is meeting his glycemic goals. He continues to lose weight and has lost 3 pounds since his last appointment with me. I congratulated patient on his success and encouraged him to continue losing weight. He has cut out sugary snacks and seems to be making healthier eating choices.   I will fax to Dr. Ramond Dial office for a copy of his lipid profile and A1C. Patient was worried that his TG were elevated and asked my opinion on niacin OTC. I explained that while the niacin could help with TG, it could also raise CBG. I advised that trying fish oil may be a better option for now.   Follow up with patient in 3 months.     Goals for Next Visit-  1. Continue weight loss- continue avoiding diet soda, increase physical activity to three days a week walking and yard work for at least 30 minutes.  2. Try restarting fish oil to help lower triglycerides. Keep the fish oil in the freezer to avoid fish burps.  3. Continue to work to keep your blood sugars at goal.   Next appointment with me is Friday October 17th at 8 AM.

## 2013-06-15 NOTE — Progress Notes (Signed)
Patient ID: Jeff Edwards, male   DOB: 06/01/1956, 57 y.o.   MRN: [id number] He appears well, in no apparent distress.  Alert and oriented times three, pleasant and cooperative. Vital signs are as documented in vital signs section.

## 2013-07-26 ENCOUNTER — Ambulatory Visit: Payer: Commercial Managed Care - PPO | Admitting: Neurology

## 2013-09-17 ENCOUNTER — Ambulatory Visit (INDEPENDENT_AMBULATORY_CARE_PROVIDER_SITE_OTHER): Payer: Self-pay | Admitting: Family Medicine

## 2013-09-17 VITALS — BP 140/84 | HR 75 | Ht 70.0 in | Wt 234.0 lb

## 2013-09-17 DIAGNOSIS — E119 Type 2 diabetes mellitus without complications: Secondary | ICD-10-CM

## 2013-09-17 NOTE — Progress Notes (Signed)
Subjective:  Patient presents today for 3 month diabetes follow-up as part of the employer-sponsored Link to Wellness program. Current diabetes regimen includes metformin 500 mg BID. Patient also continues on daily ASA, ACEi, and statin. Most recent MD follow-up was October 10th with Dr. Manus Gunning.   Patient states that he has fallen off the wagon since his last appointment with me. Weight is up from 226 to 234 lb. He states that he is not watching what he is eating, he is not counting carbs, he has increased portion sizes, he is stopping at [employer] for snacks.   He is no longer taking Voltaren gel- it didn't work very well and he was worried about side effects. He did not start taking fish oil and instead is taking flaxseed oil.   He reports that he is not sleeping well. He is still working night shift. He can sleep for about an hour and then wakes up. He has thought about using Benadryl but states that he doesn't like taking it because it "dries him out". I suggested to him trying Unisom and to also discuss this at his appointment with Dr. Manus Gunning today.   He has a new PCP= Ehinger with [facility]. He had an initial visit with him a few weeks ago and today he has a follow up appointment with him today to evaluate a possible skin cancer lesion on his back.   He had his A1C checked october 10th with Dr. Manus Gunning and it was 6.9%. He also states that there was some protein in his urine, but it wasn't enough for Dr. Manus Gunning to be worried about it.    Disease Assessments:  Diabetes:  Type of Diabetes: Type 2; Year of diagnosis 2010; Sees Diabetes provider 1 time a year; checks feet daily; uses glucometer; takes medications as prescribed; takes an aspirin a day; MD managing Diabetes Dr. Manus Gunning; Highest CBG 127; Lowest CBG 74; hypoglycemia frequency rarely;   7 day CBG average 94; 14 day CBG average 107; 30 day CBG average 101; checks blood glucose 2-6 times a week;   Other Diabetes History:   He denies hypoglycemia.   He reports that he hasn't been as good with checking blood sugar lately and is checking a handful of times a week.      Nutrition-  patient states that he has not been doing well with this since his last appointment with me.   24 hour recall  B- he works 3rd shift, so when he gets off in the morning at 6:30 AM he is not eating anything.   3 PM- sometimes if he is awake he is snacking on BBQ chips or chex mix.   6 PM Soy chicken nuggets, chinese kitchen- garlic with broccoli; 1 slice of italian bread with cheese; 1/2 cup rice  1 AM- [employer] 1 serving of macoroni and cheese; 3 soy chicken nuggets; 1 dinner roll, whole grain cookie snack from [employer]  On the days that he works he is only eating twice a day. On the weekends he is splurging more and is eating unhealthy snacks.   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 He is walking outside of work also with his dogs but reports that this is a slow stroll because his dog is 24 years old. He reports walking daily for 45 minutes.      Preventive Care:    Hemoglobin A1c: 08/27/2013 From Dr.  Ehinger's office 6.9    Dilated Eye Exam: 12/21/2012  Flu vaccine: 08/23/2013       Vital Signs:  09/17/2013 9:55 AM (EST) Blood Pressure 140 / 84 mm/HgBMI 33.6; Height 5 ft 10 in; Pulse Rate 75 bpm; Weight 234 lbs   Assessment/Plan: Patient is a 57 year old male with DM2. Patient admits that he has not being doing as well since his last appointment with me and has not been as strict with his eating regimen. This is evidenced by an 8 pound weight gain since his last appointment with me and also an increase in A1C from 6.4% to 6.9% earlier this month. Patient states that he knows he needs to change and that he wants to do better. He does not like the extra weight and states that it is harder to move around with the weight. He wants to get back to the eating  habits he had before.   We spent the majority of the visit discussing ways to improve eating habits. Patient agreed to start counting carbohydrates again and paying more attention to the food he is eating. 24 hour recall shows that he on the days he works he is only eating 2 meals a day. I encouraged him to try and add a third Kerby meal or large snack. We also discussed healthier snack ideas (patient has been eating a lot of chips and chex mix). Patient is a vegetarian and will not eat fruit, so finding snack ideas can be challenging. I suggested to him that he try and increase protein with his snacks. He does eat cheese and agreed to add a serving of string cheese in the morning.   Follow up with patient in 3 months.  .  Goals For Next Visit-  1. Get back to the eating habits you had before.  2. Start paying attention to portion sizes and start counting carbs again. Goal carbs are 45-60 grams with meals.  3. Add protein to your snacks- string cheese, yogurt, nuts.  4. Start checking your blood sugar once daily when you wake up.  5. Continue physical activity.   Next appointment to see me is Friday January 30th at 8 AM.    Ellender Hose. Vivia Ewing, PharmD, BCPS

## 2014-01-25 NOTE — Progress Notes (Signed)
Patient ID: Jeff Edwards, male   DOB: 1956/02/07, 58 y.o.   MRN: [id number] ATTENDING PHYSICIAN NOTE: I have reviewed the chart and agree with the plan as detailed above. Dorcas Mcmurray MD Pager [phone]

## 2014-01-28 ENCOUNTER — Encounter (INDEPENDENT_AMBULATORY_CARE_PROVIDER_SITE_OTHER): Payer: Commercial Managed Care - PPO | Admitting: Ophthalmology

## 2014-01-28 DIAGNOSIS — H3530 Unspecified macular degeneration: Secondary | ICD-10-CM

## 2014-01-28 DIAGNOSIS — H43819 Vitreous degeneration, unspecified eye: Secondary | ICD-10-CM

## 2014-01-28 DIAGNOSIS — H35719 Central serous chorioretinopathy, unspecified eye: Secondary | ICD-10-CM

## 2014-01-28 DIAGNOSIS — H35039 Hypertensive retinopathy, unspecified eye: Secondary | ICD-10-CM

## 2014-01-28 DIAGNOSIS — I10 Essential (primary) hypertension: Secondary | ICD-10-CM

## 2014-01-28 DIAGNOSIS — H2510 Age-related nuclear cataract, unspecified eye: Secondary | ICD-10-CM

## 2014-02-04 ENCOUNTER — Ambulatory Visit (INDEPENDENT_AMBULATORY_CARE_PROVIDER_SITE_OTHER): Payer: Self-pay | Admitting: Family Medicine

## 2014-02-04 VITALS — BP 130/81 | HR 82 | Ht 70.0 in | Wt 226.0 lb

## 2014-02-04 DIAGNOSIS — E119 Type 2 diabetes mellitus without complications: Secondary | ICD-10-CM

## 2014-02-04 NOTE — Progress Notes (Signed)
Subjective:  Patient presents today for 3 month diabetes follow-up as part of the employer-sponsored Link to Wellness program. Current diabetes regimen includes Metformin 500 mg BID. Patient also continues on daily ASA, ACEi, and statin.   Patient reports that he is "Falling apart at the seams". His R retina became detached again. He is seeing an opthalmalogist (Dr. Zigmund Daniel) and he also has polyps in his left eye. He is not receiving treatment for the detached retina at this point, they are waiting to see if it will improve on it's own. He states he can still see but has a hard time with fine print. He had norovirus a few weeks ago but has recovered from that. He also reports that he has a tear in the cartilage in his left hip and this has caused him some pain. He also reports pain in his knees.   He has a pending appointment with [name] in April. He reports that it has been a while since he had his A1C checked and requested that we check it today.    Disease Assessments:  Diabetes:  Type of Diabetes: Type 2; Year of diagnosis 2010; Sees Diabetes provider 1 time a year; checks feet daily; uses glucometer; takes medications as prescribed; takes an aspirin a day; MD managing Diabetes [name]; hypoglycemia frequency rarely; checks blood glucose 2-6 times a week; What is target A1c? %;   Highest CBG 111; Lowest CBG 78; Other Diabetes History: Didn't bring his meter to the appointment. He states that he is checking every day before he goes to work (works 3rd shift).   Patient reported high/low, 111, 78. On average it is running in the 90s. Denies hypoglycemia.   A1C today was 6.6%.     Nutrition-  He reports that he has been eating about the same as before. He has cut back on eating out and is cooking more at home.   He reports that portion control is still a big issue for him. He states that he has been more relaxed lately and isn't paying attention to snack sizes and amounts he is eating  with meals. He states that he knows what he should be doing but is often going for seconds, especially with higher carbohydrate items.   He is still eating with a vegetarian lifestyle. He is eating dairy, but no meats and no fruits.   Physical Activity-  He reports that he is walking between 6-8 miles each day; some at work and then some at home with his wife and dogs. Vital Signs:  02/04/2014 3:27 PM (EST) Blood Pressure 130 / 81 mm/HgBMI 32.4; Height 5 ft 10 in; Pulse Rate 82 bpm; Weight 226 lbs    Testing:  Blood Sugar Tests:  Hemoglobin A1c: 6.6 via POCT resulted on 02/04/2014    Objective:  Musculoskeletal: feet and toes normal.    Assessment/Plan: Patient is a 58 year old male with DM2. A1C today was 6.6% and has improved from his last result of 6.9%. This is meeting A1C goal of less than 7%. Weight has also dropped 8 pounds since his last appointment with me. Patient states that he has gotten better about the amount of food he is eating and he has continued with physical activity.   Patient wants to work on portion control, especially with snacks before his next visit with me. I congratulated patient on his success of bringing A1C down and losing 8 pounds and encouraged him to continue.   I will fax  A1C results to [name]. Follow up with patient in 4 months.  .  Goals For Next Visit-  1. Portion control- use a Doffing cup to measure out your snacks instead of eating from the package or bag.  2. Try to stay away from second helpings and second portions. Keep the food on the stove or on the counter instead of the table to prevent you from eating too much. 3. Maintain physical activity.   Next appointment to see me is Friday August 7th at 8 AM.

## 2014-03-04 ENCOUNTER — Encounter (INDEPENDENT_AMBULATORY_CARE_PROVIDER_SITE_OTHER): Payer: Commercial Managed Care - PPO | Admitting: Ophthalmology

## 2014-03-04 DIAGNOSIS — I10 Essential (primary) hypertension: Secondary | ICD-10-CM

## 2014-03-04 DIAGNOSIS — H43819 Vitreous degeneration, unspecified eye: Secondary | ICD-10-CM

## 2014-03-04 DIAGNOSIS — H35719 Central serous chorioretinopathy, unspecified eye: Secondary | ICD-10-CM

## 2014-03-04 DIAGNOSIS — H35039 Hypertensive retinopathy, unspecified eye: Secondary | ICD-10-CM

## 2014-03-04 DIAGNOSIS — H3530 Unspecified macular degeneration: Secondary | ICD-10-CM

## 2014-05-06 ENCOUNTER — Encounter (INDEPENDENT_AMBULATORY_CARE_PROVIDER_SITE_OTHER): Payer: Commercial Managed Care - PPO | Admitting: Ophthalmology

## 2014-05-13 ENCOUNTER — Encounter (INDEPENDENT_AMBULATORY_CARE_PROVIDER_SITE_OTHER): Payer: Commercial Managed Care - PPO | Admitting: Ophthalmology

## 2014-05-13 DIAGNOSIS — H2510 Age-related nuclear cataract, unspecified eye: Secondary | ICD-10-CM

## 2014-05-13 DIAGNOSIS — I10 Essential (primary) hypertension: Secondary | ICD-10-CM

## 2014-05-13 DIAGNOSIS — H35719 Central serous chorioretinopathy, unspecified eye: Secondary | ICD-10-CM

## 2014-05-13 DIAGNOSIS — H35039 Hypertensive retinopathy, unspecified eye: Secondary | ICD-10-CM

## 2014-05-13 DIAGNOSIS — H3530 Unspecified macular degeneration: Secondary | ICD-10-CM

## 2014-05-13 DIAGNOSIS — H43819 Vitreous degeneration, unspecified eye: Secondary | ICD-10-CM

## 2014-06-20 ENCOUNTER — Encounter: Payer: Self-pay | Admitting: Family Medicine

## 2014-06-20 NOTE — Progress Notes (Signed)
Patient ID: Jeff Edwards, male   DOB: [birth year], 58 y.o.   MRN: [id number] Reviewed: Agree with our Pharmacologist's documentation and management.

## 2014-07-22 ENCOUNTER — Ambulatory Visit (INDEPENDENT_AMBULATORY_CARE_PROVIDER_SITE_OTHER): Payer: Self-pay | Admitting: Family Medicine

## 2014-07-22 VITALS — BP 106/56 | Wt 232.0 lb

## 2014-07-22 DIAGNOSIS — E119 Type 2 diabetes mellitus without complications: Secondary | ICD-10-CM

## 2014-07-22 DIAGNOSIS — E1165 Type 2 diabetes mellitus with hyperglycemia: Secondary | ICD-10-CM
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
## 2014-08-19 ENCOUNTER — Encounter (INDEPENDENT_AMBULATORY_CARE_PROVIDER_SITE_OTHER): Payer: Commercial Managed Care - PPO | Admitting: Ophthalmology

## 2014-08-19 DIAGNOSIS — I10 Essential (primary) hypertension: Secondary | ICD-10-CM

## 2014-08-19 DIAGNOSIS — H43813 Vitreous degeneration, bilateral: Secondary | ICD-10-CM

## 2014-08-19 DIAGNOSIS — H35033 Hypertensive retinopathy, bilateral: Secondary | ICD-10-CM

## 2014-08-19 DIAGNOSIS — H35713 Central serous chorioretinopathy, bilateral: Secondary | ICD-10-CM

## 2014-08-19 DIAGNOSIS — H3531 Nonexudative age-related macular degeneration: Secondary | ICD-10-CM

## 2014-08-23 ENCOUNTER — Other Ambulatory Visit (HOSPITAL_COMMUNITY): Payer: Self-pay | Admitting: Orthopedic Surgery

## 2014-08-23 DIAGNOSIS — M25562 Pain in left knee: Secondary | ICD-10-CM

## 2014-08-25 ENCOUNTER — Ambulatory Visit (HOSPITAL_COMMUNITY)
Admission: RE | Admit: 2014-08-25 | Discharge: 2014-08-25 | Disposition: A | Discharge status: Home / Self Care | Payer: 59 | Source: Ambulatory Visit | Attending: Orthopedic Surgery | Admitting: Orthopedic Surgery

## 2014-08-25 DIAGNOSIS — M25562 Pain in left knee: Secondary | ICD-10-CM | POA: Insufficient documentation

## 2014-09-23 ENCOUNTER — Encounter: Payer: Self-pay | Admitting: Family Medicine

## 2014-09-23 NOTE — Progress Notes (Signed)
Patient ID: Jeff Edwards, male   DOB: 02/05/1956, 58 y.o.   MRN: [id number] Reviewed: Agree with the documentation and management of our [facility] Pharmacologist. 

## 2014-10-21 ENCOUNTER — Encounter (INDEPENDENT_AMBULATORY_CARE_PROVIDER_SITE_OTHER): Payer: Commercial Managed Care - PPO | Admitting: Ophthalmology

## 2014-10-21 DIAGNOSIS — H35733 Hemorrhagic detachment of retinal pigment epithelium, bilateral: Secondary | ICD-10-CM

## 2014-10-21 DIAGNOSIS — H35033 Hypertensive retinopathy, bilateral: Secondary | ICD-10-CM

## 2014-10-21 DIAGNOSIS — H43813 Vitreous degeneration, bilateral: Secondary | ICD-10-CM

## 2014-10-21 DIAGNOSIS — I10 Essential (primary) hypertension: Secondary | ICD-10-CM

## 2014-10-28 ENCOUNTER — Ambulatory Visit (INDEPENDENT_AMBULATORY_CARE_PROVIDER_SITE_OTHER): Payer: Self-pay | Admitting: Family Medicine

## 2014-10-28 VITALS — BP 106/52 | Wt 231.4 lb

## 2014-10-28 DIAGNOSIS — E119 Type 2 diabetes mellitus without complications: Secondary | ICD-10-CM

## 2014-10-28 NOTE — Assessment & Plan Note (Signed)
Subjective:  Patient presents today for 3 month diabetes follow-up as part of the employer-sponsored Link to Wellness program. Current diabetes regimen includes metformin 500 mg BID. Patient also continues on daily ASA, ACEi, and statin.   Patient states that things have been OK since his last visit. He states that he had a visit with [name] in Aug 25, 2023. No A1C was taken at that time. [name] told patient to return in January for follow up blood work and A1C. I will defer A1C testing to that visit. He reports no changes to medications.  Patient's father passed away in 08/25/2023. He states that he has been under more stress lately because of it.  He states that he has a tear in his left meniscus in his knee. He declined surgery and wanted to see if he would heal on his own. He states that the pain has improved.   Disease Assessments:  Diabetes: Type of Diabetes: Type 2; Year of diagnosis 2010; Sees Diabetes provider 1 time a year; checks feet daily; uses glucometer; takes medications as prescribed; takes an aspirin a day; MD managing Diabetes [name]; hypoglycemia frequency rarely; checks blood glucose 2-6 times a week; What is target A1c? %;   7 day CBG average 132; 14 day CBG average 126; 30 day CBG average 123; Highest CBG 173; Lowest CBG 105;   Other Diabetes History:  He states that he tries to check once daily fasting (this is the evening for him since he works 3rd shift).  He has a cold and he thinks that this has made his blood sugar increase lately. 7 Day average is increased compared to 14 and 30 day averages. However, all averages are 20-30 points higher than at his last visit. In the last month most of his blood sugars have been between 110-130.  Last A1C in September was 7.1%. He is scheduled to have A1C drawn in January with [name].   Tobacco Assessment: Smoking Status: Former smoker; Last Reviewed: 10/28/2014  smoked for 7; Date quit smoking: 1986   Social  History:  Diet adherence 50-75% of the time; Denies alcohol use; Caffeine use:; No drug use; Medication adherence adherent; Patient knows the purpose/use of medications; Patient can afford medications; Exercise adherence 3-4 days a week; 120 minutes of exercise per week.  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 He does not eat meat, but does eat eggs and dairy. He mainly brings his lunch from home for work. He states that when his dad died he was eating more comfort food. 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 This has led to increased eating.   One of his goals last time was to cut back on concentrated sweets to once or twice a week. He states that he hasn't been doing very well with this. He has switched to the mini/fun size chocolate which is an improvement from his last visit.   He is still bringing sandwiches with him to work. He has switched to a smaller sized roll (this is an improvement since last visit).   Patient's weight is stable since his last appointment.  Physical Activity- He is still working in patient transport and is walking every night. He states that he is usually getting 12-15 thousand steps when he works. Nothing more than that due to knee pain.  Vital Signs:  10/28/2014 8:37 AM (EST)Blood Pressure 106 / 52 mm/Hg; Height 5 ft 8.5 in; Weight 231.4 lbs   Care Planning:  Learning Preference Assessment:  Learner: Patient  Readiness to Learn Barriers: None  Teaching Method: Explanation  Evaluation of Learning: Can function independently and verbalize knowledge  Readiness to Change:  How important is your health to you? 10  How confident are you in working to improve your health? 8  How ready are you to change to improve your health? 5  Total Score: 7     Assessment/Plan: Patient is a 58 year old male with DM2. Most recent  A1C is 7.1%. CBG averages are 20 points higher than at his last appointment with me in September. Patient has not increased physical activity and reports that his eating habits are about the same. Weight is stable since his last appointment. Given these factors I expect that his A1C will either remain stable or increase when he goes to have it checked in January with [name]. I encouraged patient to make changes to eating habits, especially cutting back on concentrated sweets and limiting carbohydrates to 45-60 grams with each meal. Patient voiced understanding how to count and track carbohydrates.  Patient is under a great deal of stress at this time following the passing of his father and the resulting issues related to executing his will. I spent the majority of the visit listening to the patient vent his frustration.  At next visit have patient log in to the wellness website in order to claim his badges.  Follow up with patient in 3 months.  Goals for Next Visit- 1. Weight loss. To accomplish this- cut back on carbohydrate and concentrated sweet intake. Carbohydrate goal for each meal is 45-60 grams of carbohydrates. 2. Physical Activity- Aim for at least one day outside of work to exercise. This could be walking, chair exercises, or arm weights. 3. Look into an app to track your food. Think about tracking your food. One to try- http://vang.com/, Loseit. 4. To earn badges for the Live Life Well wellness program, think about switching your pedometer to a activity tracker. Some ideas- garmin vivo smart, vivo fit, fit bit, jawbone.  Next appointment to see me is Friday March 11th at 8 AM.

## 2014-12-01 NOTE — Progress Notes (Signed)
Patient ID: Jeff Edwards, male   DOB: 1956-08-25, 59 y.o.   MRN: [id number] Reviewed: Agree with the documentation and management of our [place].

## 2015-02-03 ENCOUNTER — Ambulatory Visit (INDEPENDENT_AMBULATORY_CARE_PROVIDER_SITE_OTHER): Payer: Self-pay | Admitting: Family Medicine

## 2015-02-03 VITALS — BP 102/64 | Wt 227.6 lb

## 2015-02-03 DIAGNOSIS — E119 Type 2 diabetes mellitus without complications: Secondary | ICD-10-CM

## 2015-02-03 NOTE — Progress Notes (Signed)
Subjective:  Patient presents today for 3 month diabetes follow-up as part of the employer-sponsored Link to Wellness program. Current diabetes regimen includes metformin 1000 mg BID. Patient also continues on daily ASA, ACEi, and statin. Metformin was increased to 1000 mg BID at his last visit with [name] last month. A1C at that time was 7.4%. Simvastatin was changed to atorvastatin 20 mg daily. No major health changes at this time. He reports that he has had increased flatulence and diarrhea, likely secondary to increase of metformin. He has also signed up for the [facility] study for diabetes. Next appointment to see [name] is in May.   Disease Assessments:  Diabetes: Type of Diabetes: Type 2; Year of diagnosis 2010; Sees Diabetes provider 1 time a year; checks feet daily; uses glucometer; takes medications as prescribed; takes an aspirin a day; MD managing Diabetes [name]; hypoglycemia frequency rarely; checks blood glucose 2-6 times a week; What is target A1c? %;   Highest CBG 173; Lowest CBG 105; 7 day CBG average 122; 14 day CBG average 123;   Other Diabetes History:  He states that he tries to check once daily fasting (this is the evening for him since he works 3rd shift). He forgot his meter last week when he had to leave unexpectedly for a family illness and he did not check during that week.  Last A1C in February was 7.4%. At that point metformin was increased to 1000 mg BID. He denies hypoglycemia. On average most readings are between 115-135.     Social History:  Diet adherence 50-75% of the time; Denies alcohol use; Caffeine use:; No drug use; Medication adherence adherent; Patient knows the purpose/use of medications; Patient can afford medications; Exercise adherence 5 days or more days a week; 150 minutes of exercise per week.  Occupation: Transport- ED on [facility]  Nutrition- He states he has cut back on concentrated sweet intake. Weight is down 4 pounds since he last  saw me. He reports no other changes with his diet. He is not counting carbohydrates with meals.  Physical Activity- He is still working in patient transport and is walking every night. He has purchased a fitbit and states he is averaging about 15,000 steps a night usually. On the days that he walks his dogs he is getting closer to 20-25 thousand steps. He has the app connected to the wellness benefits. He is walking daily with his dogs also, longer walks on Saturday and Sunday with his dogs.    Preventive Care:    Hemoglobin A1c: 07/22/2014 via POCT 7.1    Dilated Eye Exam: 06/20/2014 He states that the detached retina has mostly healed now.  Flu vaccine: 08/18/2014  Other Preventive Care Notes:  Dental Exam- gets twice yearly. Last exam in June.      Vital Signs:  02/03/2015 9:31 AM (EST)Blood Pressure 102 / 64 mm/HgBMI 34.1; Height 5 ft 8.5 in; Weight 227.6 lbs   Testing:  Blood Sugar Tests: Hemoglobin A1c: 7.4 via [name] resulted on 01/16/2015   Care Planning:  Learning Preference Assessment:  Learner: Patient  Readiness to Learn Barriers: None  Teaching Method: Explanation  Evaluation of Learning: Can function independently and verbalize knowledge  Readiness to Change:  How important is your health to you? 10  How confident are you in working to improve your health? 8  How ready are you to change to improve your health? 5  Total Score: 7  Care Plan:  02/03/2015 9:31 AM (EST) (1)  Problem: Wellness Badges  Role: Clinical Pharmacist  Long Term Goal Complete the online health wellness profile. Also fix your fitbit app so that you can track your steps and workouts.  Date Started: 02/03/2015   Teaching Goals:  Assessment/Plan: Patient is a 59 year old male with DM2. Most recent A1C was 7.4% and is slightly above goal of less than 7%. At his most recent visit with [name] metformin was increased to 1000 mg BID. Since then CBG averages are closer to goal, fasting  averaging 123. Patient continues to engage in physical activity weekly. He has cut back on concentrated sweet intake and weight is down about 4 pounds.  Showed patient how to claim remaining badges for the live life well website. Follow up with patient in 3 months.  Goals for Next Visit- 1. Fix the fitbit app on your phone so that the app will sync with the wellness rewards. 2. When you are going for a walk with the dogs, track this on your fitbit so that it will transfer as a "workout" on the wellness website. 3. Start the [facility] study and complete the requirements for the study. Next appointment to see me is Friday June 17th at 8 AM.

## 2015-02-10 ENCOUNTER — Ambulatory Visit: Payer: 59 | Admitting: Pharmacist

## 2015-02-20 NOTE — Progress Notes (Signed)
Patient ID: Jeff Edwards., male   DOB: Nov 16, 1956, 59 y.o.   MRN: [id number] Reviewed: Agree with the documentation and management of our [place].

## 2015-04-14 ENCOUNTER — Encounter: Payer: Self-pay | Admitting: Pharmacist

## 2015-05-05 ENCOUNTER — Ambulatory Visit: Payer: 59 | Admitting: Pharmacist

## 2015-05-12 ENCOUNTER — Ambulatory Visit (INDEPENDENT_AMBULATORY_CARE_PROVIDER_SITE_OTHER): Payer: Self-pay | Admitting: Family Medicine

## 2015-05-12 ENCOUNTER — Ambulatory Visit: Payer: 59 | Admitting: Pharmacist

## 2015-05-12 VITALS — BP 104/60 | Ht 68.5 in | Wt 222.4 lb

## 2015-05-12 DIAGNOSIS — E119 Type 2 diabetes mellitus without complications: Secondary | ICD-10-CM

## 2015-05-12 NOTE — Progress Notes (Signed)
Subjective:  Patient presents today for 3 month diabetes follow-up as part of the employer-sponsored Link to Wellness program.  Current diabetes regimen includes metformin 1000 mg BID. Patient also continues on daily ASA, ACE Inhibitor and statin. No med changes or major health changes at this time.   Last visit with [name] was in May. A1C at that time was 7.1%. Next follow up appointment is in November.    Patient states that he finished using the [employer] diabetes trial. He states that it was good motivation and enjoyed using the app.   Assessment/Plan:  Patient is a 59 y.o. male with DM 2. Most recent A1C was 7.1  % which is slightly exceeding goal of less than 7%. Weight is decreased by 5 pounds from last visit with me.   CBG Review: Patient is checking 1-2 times daily, usually at least once fasting (when he wakes up in the afternoon, pt works 3rd shift). Denies hypoglycemia. Majority of readings are between 80-120; occasional readings in the 150-160s. Patient states that these are usually after eating rice.   Lifestyle improvements:  Physical Activity-  Patient is tracking his steps and on average is getting 17,000 steps. On the days that he is working he is getting over 20,000 steps. Outside of work he is walking with his dogs.    Nutrition-  Patient is a vegetarian. He gets the majority of his protein from lentils, beans, tofu, soy meat substitutes.    Overall patient has made good progress since his last visit with me, especially with cutting back on portion sizes and limiting concentrated sweets. These changes have led to a 5 pound weight loss since his last visit with me.   Follow up with me in 3 months.    Goals for Next Visit:  1. Call and make an appointment to see the dietitian at the [facility], [phone]. Then go back into the Live Life Well website to claim your healthy weight badge (password is [place]) 2. Complete  the wellness profile on the wellness website.  3. Continue to lose weight by continuing physical activity and cutting back on portion sizes and concentrated sweets.     Next appointment to see me is: Friday October 7th at 7:45 AM.    Truett Mainland. Donneta Romberg, PharmD, BCPS, CDE [facility] [place] Coordinator [phone]

## 2015-05-23 NOTE — Progress Notes (Signed)
Patient ID: Jeff Norris., male   DOB: 1955-12-10, 59 y.o.   MRN: [id number] ATTENDING PHYSICIAN NOTE: I have reviewed the chart and agree with the plan as detailed above. Dorcas Mcmurray MD Pager [phone]

## 2015-06-23 ENCOUNTER — Encounter: Payer: 59 | Attending: Family Medicine | Admitting: *Deleted

## 2015-06-23 ENCOUNTER — Encounter: Payer: Self-pay | Admitting: *Deleted

## 2015-06-23 VITALS — Ht 69.0 in | Wt 225.4 lb

## 2015-06-23 DIAGNOSIS — Z713 Dietary counseling and surveillance: Secondary | ICD-10-CM | POA: Diagnosis not present

## 2015-06-23 DIAGNOSIS — E119 Type 2 diabetes mellitus without complications: Secondary | ICD-10-CM | POA: Diagnosis present

## 2015-06-23 NOTE — Patient Instructions (Signed)
Plan:  Aim for 4 Carb Choices per meal (60 grams) +/- 1 either way  Aim for 0-2 Carbs per snack if hungry  Include protein in moderation with your meals and snacks Consider reading food labels for Total Carbohydrate of foods Continue with your activity level by walking and yard work daily as tolerated Consider checking BG at alternate times per day  Continue taking diabetes medication Metformin as directed

## 2015-06-23 NOTE — Progress Notes (Signed)
Diabetes Self-Management Education  Visit Type: First/Initial  Appt. Start Time: 0730 Appt. End Time: 0900  06/23/2015  [name], identified by name and date of birth, is a 59 y.o. male with a diagnosis of Diabetes: Type 2.  Other people present during visit:  Patient   ASSESSMENT  Height 5\' 9"  (1.753 m), weight 225 lb 6.4 oz (102.241 kg). Body mass index is 33.27 kg/(m^2).  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 He states he is vegetarian, enjoys higher carb foods, does not eat salads or fruits at this time. Are you currently following a meal plan?: No   Are you taking your medications as prescribed?: Yes Are you checking your feet?: Yes How many days per week are you checking your feet?: 7 How often do you need to have someone help you when you read instructions, pamphlets, or other written materials from your doctor or pharmacy?: 1 - Never What is the last grade level you completed in school?: some college  Psychosocial:     Patient Belief/Attitude about Diabetes: Motivated to manage diabetes (burned out sometimes) Self-care barriers: None Self-management support: Family, CDE visits, Friends Other persons present: Patient Patient Concerns: Nutrition/Meal planning, Weight Control Preferred Learning Style: Hands on, Visual Learning Readiness: Change in progress  Complications:   Last HgB A1C per patient/outside source: 7.1 % How often do you check your blood sugar?: 1-2 times/day (when gets up from sleeping so it is FBG) Fasting Blood glucose range (mg/dL): 70-129 Number of hypoglycemic episodes per month: 0 Have you had a dilated eye exam in the past 12 months?: Yes (history of detached retina in right eye but related to medication for knee, not diabetes) Have you had a dental exam in the past 12 months?: Yes  Diet Intake:  Breakfast: vegetable fried brown rice with extra vegetables OR [place] food with beans and  tofu, OR pasta OR pizza (5 PM) Snack (morning): lunch pack of chips or crackers (3 PM) Lunch: left overs from weekend, blend of starch and vegetables with vegetarian protein sources (1 AM) Dinner: none Beverage(s): water, diet soda  Exercise:  Exercise: Moderate (swimming / aerobic walking) (walks with job as transporter 8 hours a day) Moderate Exercise amount of time (min / week): 150  Individualized Plan for Diabetes Self-Management Training:   Learning Objective:  Patient will have a greater understanding of diabetes self-management.  Patient education plan per assessed needs and concerns is to attend individual sessions for     Education Topics Reviewed with Patient Today:  Definition of diabetes, type 1 and 2, and the diagnosis of diabetes Food label reading, portion sizes and measuring food., Role of diet in the treatment of diabetes and the relationship between the three main macronutrients and blood glucose level, Carbohydrate counting, Other (comment) (Plan for weight loss) Role of exercise on diabetes management, blood pressure control and cardiac health. Reviewed patients medication for diabetes, action, purpose, timing of dose and side effects. Identified appropriate SMBG and/or A1C goals.   Relationship between chronic complications and blood glucose control Role of stress on diabetes      PATIENTS GOALS/Plan (Developed by the patient):  Nutrition: Follow meal plan discussed Physical Activity: Exercise 5-7 days per week Medications: take my medication as prescribed Monitoring : test blood glucose pre and post meals as discussed  Plan:   Patient Instructions  Plan:  Aim for 4 Carb Choices per meal (60 grams) +/- 1 either way  Aim for 0-2  Carbs per snack if hungry  Include protein in moderation with your meals and snacks Consider reading food labels for Total Carbohydrate of foods Continue with your activity level by walking and yard work daily as  tolerated Consider checking BG at alternate times per day  Continue taking diabetes medication Metformin as directed       Expected Outcomes:  Demonstrated interest in learning. Expect positive outcomes  Education material provided: Living Well with Diabetes, A1C conversion sheet, Meal plan card and Carbohydrate counting sheet  If problems or questions, patient to contact team via:  Phone and Email  Future DSME appointment: PRN

## 2015-08-25 ENCOUNTER — Ambulatory Visit: Payer: 59 | Admitting: Pharmacist

## 2015-09-01 ENCOUNTER — Ambulatory Visit: Payer: 59 | Admitting: Pharmacist

## 2015-09-08 ENCOUNTER — Encounter: Payer: Self-pay | Admitting: Pharmacist

## 2015-09-08 ENCOUNTER — Ambulatory Visit (INDEPENDENT_AMBULATORY_CARE_PROVIDER_SITE_OTHER): Payer: Self-pay | Admitting: Family Medicine

## 2015-09-08 VITALS — BP 118/68 | Ht 69.0 in | Wt 233.0 lb

## 2015-09-08 DIAGNOSIS — E119 Type 2 diabetes mellitus without complications: Secondary | ICD-10-CM

## 2015-09-08 NOTE — Progress Notes (Signed)
Subjective:  Patient presents today for 3 month diabetes follow-up as part of the employer-sponsored Link to Wellness program.  Current diabetes regimen includes metformin 1000 mg BID.  Patient also continues on daily ASA, ACE Inhibitor and statin.  No med changes or major health changes at this time.   Last visit with [name] was in May 2016.   Next visit is pending with [name] in December.   Assessment:  Diabetes: Most recent A1C was 7.2 % which is slightly exceeding goal of less than 7%. Weight is increased by 10 pounds from last visit with me.    CBG Review: Patient is checking a few times a week. 7 day average is higher than both 14 and 30 day average. This corresponds with patient's report that he has been eating more sweets lately.    Gave patient a new True metrix meter today with test strips and lancets.  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 He is also walking three days a week for 50 minutes outside of work.    Nutrition-  Patient is still not eating meat. He is getting protein from soy and tofu products.   Patient admits that lately he hasn't been making healthy eating choices. He is eating BBQ chips, sweets, cookies, cinnamon rolls, pastry, donuts.   He admits that he is eating >60 grams of carbohydrates with his meals and thinks he is overeating.   Follow up with me in 3 months.   I will fax A1C result to Dr. Andrew Au office.    Plan/Goals for Next Visit:  1. Reduce A1C to less than 7%. Ways to do this- reduce consumption of concentrated sweets to no more than 1 serving twice a week. Stop taking sweets as a treat for lunch.  2. Aim for 45-60 grams of carbohydrates with meals.  3. Continue physical activity- three days a week of walking for 50 minutes.     Next appointment to see me is: Friday January 20th at 8 AM.   Truett Mainland. Donneta Romberg, PharmD, BCPS,  CDE [facility] [place] Coordinator [phone]

## 2015-09-13 ENCOUNTER — Ambulatory Visit (HOSPITAL_COMMUNITY)
Admission: RE | Admit: 2015-09-13 | Discharge: 2015-09-13 | Disposition: A | Discharge status: Home / Self Care | Payer: 59 | Source: Ambulatory Visit | Attending: Family Medicine | Admitting: Family Medicine

## 2015-09-13 ENCOUNTER — Other Ambulatory Visit (HOSPITAL_COMMUNITY): Payer: Self-pay | Admitting: Family Medicine

## 2015-09-13 DIAGNOSIS — M79604 Pain in right leg: Secondary | ICD-10-CM | POA: Diagnosis not present

## 2015-09-13 DIAGNOSIS — M7989 Other specified soft tissue disorders: Secondary | ICD-10-CM

## 2015-09-13 DIAGNOSIS — M79661 Pain in right lower leg: Secondary | ICD-10-CM

## 2015-09-13 DIAGNOSIS — M79662 Pain in left lower leg: Secondary | ICD-10-CM | POA: Diagnosis not present

## 2015-09-13 NOTE — Progress Notes (Signed)
Preliminary results by tech - Left Lower Ext. Venous Duplex Completed. Negative for deep or superficial vein thrombosis in the left lower extremity. Oda Cogan, BS, RDMS, RVT

## 2015-09-15 NOTE — Progress Notes (Signed)
I have reviewed this pharmacist's note  

## 2015-09-25 ENCOUNTER — Other Ambulatory Visit (HOSPITAL_COMMUNITY): Payer: Self-pay | Admitting: Sports Medicine

## 2015-09-25 DIAGNOSIS — M25562 Pain in left knee: Secondary | ICD-10-CM

## 2015-09-26 ENCOUNTER — Other Ambulatory Visit: Payer: Self-pay | Admitting: Sports Medicine

## 2015-09-26 DIAGNOSIS — M25562 Pain in left knee: Secondary | ICD-10-CM

## 2015-10-06 ENCOUNTER — Ambulatory Visit (HOSPITAL_COMMUNITY): Payer: 59

## 2015-10-07 ENCOUNTER — Ambulatory Visit
Admission: RE | Admit: 2015-10-07 | Discharge: 2015-10-07 | Disposition: A | Discharge status: Home / Self Care | Payer: 59 | Source: Ambulatory Visit | Attending: Sports Medicine | Admitting: Sports Medicine

## 2015-10-07 DIAGNOSIS — M25562 Pain in left knee: Secondary | ICD-10-CM

## 2015-10-17 ENCOUNTER — Other Ambulatory Visit (HOSPITAL_COMMUNITY): Payer: Self-pay | Admitting: Orthopedic Surgery

## 2015-10-25 ENCOUNTER — Encounter (HOSPITAL_COMMUNITY): Payer: Self-pay

## 2015-10-25 ENCOUNTER — Encounter (HOSPITAL_COMMUNITY)
Admission: RE | Admit: 2015-10-25 | Discharge: 2015-10-25 | Disposition: A | Discharge status: Home / Self Care | Payer: 59 | Source: Ambulatory Visit | Attending: Orthopedic Surgery | Admitting: Orthopedic Surgery

## 2015-10-25 ENCOUNTER — Other Ambulatory Visit (HOSPITAL_COMMUNITY): Payer: Self-pay | Admitting: *Deleted

## 2015-10-25 DIAGNOSIS — I10 Essential (primary) hypertension: Secondary | ICD-10-CM | POA: Diagnosis not present

## 2015-10-25 DIAGNOSIS — Z7982 Long term (current) use of aspirin: Secondary | ICD-10-CM | POA: Diagnosis not present

## 2015-10-25 DIAGNOSIS — Z79899 Other long term (current) drug therapy: Secondary | ICD-10-CM | POA: Diagnosis not present

## 2015-10-25 DIAGNOSIS — M2342 Loose body in knee, left knee: Secondary | ICD-10-CM | POA: Diagnosis not present

## 2015-10-25 DIAGNOSIS — K219 Gastro-esophageal reflux disease without esophagitis: Secondary | ICD-10-CM | POA: Diagnosis not present

## 2015-10-25 DIAGNOSIS — E1142 Type 2 diabetes mellitus with diabetic polyneuropathy: Secondary | ICD-10-CM | POA: Diagnosis not present

## 2015-10-25 DIAGNOSIS — M23222 Derangement of posterior horn of medial meniscus due to old tear or injury, left knee: Secondary | ICD-10-CM | POA: Diagnosis not present

## 2015-10-25 DIAGNOSIS — Z87891 Personal history of nicotine dependence: Secondary | ICD-10-CM | POA: Diagnosis not present

## 2015-10-25 DIAGNOSIS — Z7984 Long term (current) use of oral hypoglycemic drugs: Secondary | ICD-10-CM | POA: Diagnosis not present

## 2015-10-25 DIAGNOSIS — F909 Attention-deficit hyperactivity disorder, unspecified type: Secondary | ICD-10-CM | POA: Diagnosis not present

## 2015-10-25 DIAGNOSIS — M1712 Unilateral primary osteoarthritis, left knee: Secondary | ICD-10-CM | POA: Diagnosis present

## 2015-10-25 DIAGNOSIS — G4733 Obstructive sleep apnea (adult) (pediatric): Secondary | ICD-10-CM | POA: Diagnosis not present

## 2015-10-25 HISTORY — DX: Anemia, unspecified: D64.9

## 2015-10-25 HISTORY — DX: Diverticulitis of intestine, part unspecified, without perforation or abscess without bleeding: K57.92

## 2015-10-25 HISTORY — DX: Serous retinal detachment, unspecified eye: H33.20

## 2015-10-25 HISTORY — DX: Polyneuropathy, unspecified: G62.9

## 2015-10-25 LAB — CBC
HCT: 43.7 % (ref 39.0–52.0)
HEMOGLOBIN: 14 g/dL (ref 13.0–17.0)
MCH: 26.4 pg (ref 26.0–34.0)
MCHC: 32 g/dL (ref 30.0–36.0)
MCV: 82.3 fL (ref 78.0–100.0)
Platelets: 280 10*3/uL (ref 150–400)
RBC: 5.31 MIL/uL (ref 4.22–5.81)
RDW: 13.6 % (ref 11.5–15.5)
WBC: 9 10*3/uL (ref 4.0–10.5)

## 2015-10-25 LAB — BASIC METABOLIC PANEL
ANION GAP: 9 (ref 5–15)
BUN: 12 mg/dL (ref 6–20)
CALCIUM: 9.4 mg/dL (ref 8.9–10.3)
CO2: 29 mmol/L (ref 22–32)
Chloride: 100 mmol/L — ABNORMAL LOW (ref 101–111)
Creatinine, Ser: 0.85 mg/dL (ref 0.61–1.24)
GFR calc Af Amer: >60 mL/min (ref >60)
GFR calc non Af Amer: >60 mL/min (ref >60)
GLUCOSE: 269 mg/dL — AB (ref 65–99)
Potassium: 4.4 mmol/L (ref 3.5–5.1)
Sodium: 138 mmol/L (ref 135–145)

## 2015-10-25 LAB — GLUCOSE, CAPILLARY: Glucose-Capillary: 226 mg/dL — ABNORMAL HIGH (ref 65–99)

## 2015-10-25 NOTE — Progress Notes (Signed)
Pt denies cardiac history, chest pain or sob. Pt is diabetic, states his last A1C was 7.2 in October, 2016. Pt is followed by [facility] Pharmacist for his diabetes. Pt's CBG today was 220, states he ate a big breakfast between 6:30 and 7:00 this morning. States his fasting blood sugar is usually mid 120's.

## 2015-10-25 NOTE — Progress Notes (Signed)
Anesthesia Chart Review:  Pt is 59 year old male scheduled for L knee arthroscopy and debridement on 10/27/2015 with Dr. Sharol Given.   PMH includes:  HTN, DM, OSA (does not use CPAP, has lost weight), ADHD, GERD. Former smoker. BMI 34. S/p R knee arthroscopy 07/10/12.   Medications include: ASA, lipitor, lisinopril, metformin, methylphenidate, protonix.   Preoperative labs reviewed.  Glucose 269. Pt reports hgbA1c was 7.2 in October, and that fasting blood sugar is usually in 120's.   EKG 10/25/15: NSR. Low voltage QRS.   Called and spoke with pt about blood sugar. Encouraged him to be strict with his diet today and tomorrow in preparation for surgery 10/27/15.   If glucose acceptable DOS, I anticipate pt can proceed as scheduled.   Willeen Cass, FNP-BC [facility]/Anesthesiology Phone: [phone] 10/25/2015 2:02 PM

## 2015-10-25 NOTE — Pre-Procedure Instructions (Addendum)
Jeff Edwards.  10/25/2015     Your procedure is scheduled on Friday, October 27, 2015 at 12:15 PM.   Report to [facility] Entrance "A" Admitting at 10:15 PM.   Call this number if you have problems the morning of surgery: [phone]   Any questions prior to day of surgery, please call [phone] between 8 & 4 PM.   Remember:  Do not eat food or drink liquids after midnight Thursday, 11/05/15.  Take these medicines the morning of surgery with A SIP OF WATER: Cetirizine (Zyrtec), Pantoprazole (Protonix)  Stop Aspirin, Herbal Medicatons and Multi-vitamins as of today.  How to Manage Your Diabetes Before Surgery   Why is it important to control my blood sugar before and after surgery?   Improving blood sugar levels before and after surgery helps healing and can limit problems.  A way of improving blood sugar control is eating a healthy diet by:  - Eating less sugar and carbohydrates  - Increasing activity/exercise  - Talk with your doctor about reaching your blood sugar goals  High blood sugars (greater than 180 mg/dL) can raise your risk of infections and slow down your recovery so you will need to focus on controlling your diabetes during the weeks before surgery.  Make sure that the doctor who takes care of your diabetes knows about your planned surgery including the date and location.  How do I manage my blood sugars before surgery?   Check your blood sugar at least 4 times a day, 2 days before surgery to make sure that they are not too high or low.   Check your blood sugar the morning of your surgery when you wake up and every 2 hours until you get to the Short-Stay unit.   Treat a low blood sugar (less than 70 mg/dL) with 1/2 cup of clear juice (cranberry or apple), 4 glucose tablets, OR glucose gel.   Recheck blood sugar in 15 minutes after treatment (to make sure it is greater than 70 mg/dL).  If blood sugar is not greater than 70 mg/dL on  re-check, call [phone] for further instructions.    Report your blood sugar to the Short-Stay nurse when you get to Short-Stay.  References:  [facility], 2007 "How to Manage your Diabetes Before and After Surgery".  What do I do about my diabetes medications?   Do not take oral diabetes medicines (pills) the morning of surgery.   Do not wear jewelry.  Do not wear lotions, powders, or cologne.  You may wear deodorant.  Men may shave face and neck.  Do not bring valuables to the hospital.  [facility] is not responsible for any belongings or valuables.  Contacts, dentures or bridgework may not be worn into surgery.  Leave your suitcase in the car.  After surgery it may be brought to your room.  For patients admitted to the hospital, discharge time will be determined by your treatment team.  Patients discharged the day of surgery will not be allowed to drive home.   Special instructions:  [facility] - Preparing for Surgery  Before surgery, you can play an important role.  Because skin is not sterile, your skin needs to be as free of germs as possible.  You can reduce the number of germs on you skin by washing with CHG (chlorahexidine gluconate) soap before surgery.  CHG is an antiseptic cleaner which kills germs and bonds with the skin to continue killing  germs even after washing.  Please DO NOT use if you have an allergy to CHG or antibacterial soaps.  If your skin becomes reddened/irritated stop using the CHG and inform your nurse when you arrive at Short Stay.  Do not shave (including legs and underarms) for at least 48 hours prior to the first CHG shower.  You may shave your face.  Please follow these instructions carefully:   1.  Shower with CHG Soap the night before surgery and the                                morning of Surgery.  2.  If you choose to wash your hair, wash your hair first as usual with your       normal shampoo.  3.  After  you shampoo, rinse your hair and body thoroughly to remove the                      Shampoo.  4.  Use CHG as you would any other liquid soap.  You can apply chg directly       to the skin and wash gently with scrungie or a clean washcloth.  5.  Apply the CHG Soap to your body ONLY FROM THE NECK DOWN.        Do not use on open wounds or open sores.  Avoid contact with your eyes, ears, mouth and genitals (private parts).  Wash genitals (private parts) with your normal soap.  6.  Wash thoroughly, paying special attention to the area where your surgery        will be performed.  7.  Thoroughly rinse your body with warm water from the neck down.  8.  DO NOT shower/wash with your normal soap after using and rinsing off       the CHG Soap.  9.  Pat yourself dry with a clean towel.            10.  Wear clean pajamas.            11.  Place clean sheets on your bed the night of your first shower and do not        sleep with pets.  Day of Surgery  Do not apply any lotions the morning of surgery.  Please wear clean clothes to the hospital.   Please read over the following fact sheets that you were given. Pain Booklet, Coughing and Deep Breathing and Surgical Site Infection Prevention

## 2015-10-26 MED ORDER — CHLORHEXIDINE GLUCONATE 4 % EX LIQD: 60.0000 mL | Freq: Once | CUTANEOUS | Status: DC

## 2015-10-26 MED ORDER — CLINDAMYCIN PHOSPHATE 900 MG/50ML IV SOLN
900.0000 mg | INTRAVENOUS | Status: AC
  Administered 2015-10-27: 900 mg via INTRAVENOUS
  Filled 2015-10-26: qty 50

## 2015-10-27 ENCOUNTER — Encounter (HOSPITAL_COMMUNITY): Payer: Self-pay | Admitting: *Deleted

## 2015-10-27 ENCOUNTER — Ambulatory Visit (HOSPITAL_COMMUNITY): Payer: 59 | Admitting: Emergency Medicine

## 2015-10-27 ENCOUNTER — Ambulatory Visit (HOSPITAL_COMMUNITY): Payer: 59 | Admitting: Anesthesiology

## 2015-10-27 ENCOUNTER — Ambulatory Visit (HOSPITAL_COMMUNITY)
Admission: RE | Admit: 2015-10-27 | Discharge: 2015-10-27 | Disposition: A | Discharge status: Home / Self Care | Payer: 59 | Source: Ambulatory Visit | Attending: Orthopedic Surgery | Admitting: Orthopedic Surgery

## 2015-10-27 ENCOUNTER — Ambulatory Visit (INDEPENDENT_AMBULATORY_CARE_PROVIDER_SITE_OTHER): Payer: Commercial Managed Care - PPO | Admitting: Ophthalmology

## 2015-10-27 ENCOUNTER — Encounter (HOSPITAL_COMMUNITY)
Admission: RE | Disposition: A | Discharge status: Home / Self Care | Payer: Self-pay | Source: Ambulatory Visit | Attending: Orthopedic Surgery

## 2015-10-27 DIAGNOSIS — E1142 Type 2 diabetes mellitus with diabetic polyneuropathy: Secondary | ICD-10-CM | POA: Insufficient documentation

## 2015-10-27 DIAGNOSIS — F909 Attention-deficit hyperactivity disorder, unspecified type: Secondary | ICD-10-CM | POA: Insufficient documentation

## 2015-10-27 DIAGNOSIS — Z7984 Long term (current) use of oral hypoglycemic drugs: Secondary | ICD-10-CM | POA: Insufficient documentation

## 2015-10-27 DIAGNOSIS — I10 Essential (primary) hypertension: Secondary | ICD-10-CM | POA: Insufficient documentation

## 2015-10-27 DIAGNOSIS — M1712 Unilateral primary osteoarthritis, left knee: Secondary | ICD-10-CM | POA: Diagnosis not present

## 2015-10-27 DIAGNOSIS — K219 Gastro-esophageal reflux disease without esophagitis: Secondary | ICD-10-CM | POA: Insufficient documentation

## 2015-10-27 DIAGNOSIS — M2342 Loose body in knee, left knee: Secondary | ICD-10-CM | POA: Insufficient documentation

## 2015-10-27 DIAGNOSIS — S838X2A Sprain of other specified parts of left knee, initial encounter: Secondary | ICD-10-CM

## 2015-10-27 DIAGNOSIS — M23222 Derangement of posterior horn of medial meniscus due to old tear or injury, left knee: Secondary | ICD-10-CM | POA: Insufficient documentation

## 2015-10-27 DIAGNOSIS — Z7982 Long term (current) use of aspirin: Secondary | ICD-10-CM | POA: Insufficient documentation

## 2015-10-27 DIAGNOSIS — G4733 Obstructive sleep apnea (adult) (pediatric): Secondary | ICD-10-CM | POA: Insufficient documentation

## 2015-10-27 DIAGNOSIS — Z87891 Personal history of nicotine dependence: Secondary | ICD-10-CM | POA: Insufficient documentation

## 2015-10-27 DIAGNOSIS — Z79899 Other long term (current) drug therapy: Secondary | ICD-10-CM | POA: Insufficient documentation

## 2015-10-27 HISTORY — PX: KNEE ARTHROSCOPY: SHX127

## 2015-10-27 LAB — GLUCOSE, CAPILLARY
GLUCOSE-CAPILLARY: 106 mg/dL — AB (ref 65–99)
Glucose-Capillary: 132 mg/dL — ABNORMAL HIGH (ref 65–99)

## 2015-10-27 SURGERY — ARTHROSCOPY, KNEE
Anesthesia: General | Site: Knee | Laterality: Left

## 2015-10-27 MED ORDER — HYDROMORPHONE HCL 1 MG/ML IJ SOLN
0.2500 mg | INTRAMUSCULAR | Status: DC | PRN
  Administered 2015-10-27: 0.5 mg via INTRAVENOUS

## 2015-10-27 MED ORDER — ONDANSETRON HCL 4 MG/2ML IJ SOLN
INTRAMUSCULAR | Status: AC
  Filled 2015-10-27: qty 2

## 2015-10-27 MED ORDER — PROPOFOL 10 MG/ML IV BOLUS
INTRAVENOUS | Status: AC
  Filled 2015-10-27: qty 20

## 2015-10-27 MED ORDER — MIDAZOLAM HCL 5 MG/5ML IJ SOLN
INTRAMUSCULAR | Status: DC | PRN
  Administered 2015-10-27: 2 mg via INTRAVENOUS

## 2015-10-27 MED ORDER — LIDOCAINE HCL (CARDIAC) 20 MG/ML IV SOLN
INTRAVENOUS | Status: DC | PRN
Start: 2015-10-27 — End: 2015-10-27
  Administered 2015-10-27: 60 mg via INTRAVENOUS

## 2015-10-27 MED ORDER — SODIUM CHLORIDE 0.9 % IR SOLN
Status: DC | PRN
  Administered 2015-10-27: 6000 mL

## 2015-10-27 MED ORDER — MIDAZOLAM HCL 2 MG/2ML IJ SOLN
INTRAMUSCULAR | Status: AC
  Filled 2015-10-27: qty 2

## 2015-10-27 MED ORDER — MIDAZOLAM HCL 2 MG/2ML IJ SOLN
2.0000 mg | Freq: Once | INTRAMUSCULAR | Status: AC
  Administered 2015-10-27: 2 mg via INTRAVENOUS
  Filled 2015-10-27: qty 2

## 2015-10-27 MED ORDER — OXYCODONE-ACETAMINOPHEN 5-325 MG PO TABS
ORAL_TABLET | ORAL | Status: AC
  Administered 2015-10-27: 1
  Filled 2015-10-27: qty 1

## 2015-10-27 MED ORDER — BUPIVACAINE-EPINEPHRINE (PF) 0.5% -1:200000 IJ SOLN
INTRAMUSCULAR | Status: DC | PRN
  Administered 2015-10-27: 30 mL

## 2015-10-27 MED ORDER — HYDROMORPHONE HCL 1 MG/ML IJ SOLN
INTRAMUSCULAR | Status: AC
  Administered 2015-10-27: 0.5 mg via INTRAVENOUS
  Filled 2015-10-27: qty 1

## 2015-10-27 MED ORDER — LIDOCAINE HCL (CARDIAC) 20 MG/ML IV SOLN
INTRAVENOUS | Status: AC
  Filled 2015-10-27: qty 5

## 2015-10-27 MED ORDER — FENTANYL CITRATE (PF) 100 MCG/2ML IJ SOLN
100.0000 ug | Freq: Once | INTRAMUSCULAR | Status: AC
  Administered 2015-10-27: 100 ug via INTRAVENOUS
  Filled 2015-10-27: qty 2

## 2015-10-27 MED ORDER — OXYCODONE-ACETAMINOPHEN 5-325 MG PO TABS: 1.0000 | ORAL_TABLET | ORAL | Status: DC | PRN

## 2015-10-27 MED ORDER — FENTANYL CITRATE (PF) 100 MCG/2ML IJ SOLN
INTRAMUSCULAR | Status: DC | PRN
  Administered 2015-10-27: 25 ug via INTRAVENOUS

## 2015-10-27 MED ORDER — PROPOFOL 10 MG/ML IV BOLUS
INTRAVENOUS | Status: DC | PRN
  Administered 2015-10-27: 170 mg via INTRAVENOUS

## 2015-10-27 MED ORDER — FENTANYL CITRATE (PF) 250 MCG/5ML IJ SOLN
INTRAMUSCULAR | Status: AC
  Filled 2015-10-27: qty 5

## 2015-10-27 MED ORDER — LACTATED RINGERS IV SOLN
INTRAVENOUS | Status: DC
  Administered 2015-10-27: 12:00:00 via INTRAVENOUS

## 2015-10-27 MED ORDER — ONDANSETRON HCL 4 MG/2ML IJ SOLN
INTRAMUSCULAR | Status: DC | PRN
  Administered 2015-10-27: 4 mg via INTRAVENOUS

## 2015-10-27 MED ORDER — HYDROCODONE-ACETAMINOPHEN 5-325 MG PO TABS: 1.0000 | ORAL_TABLET | Freq: Four times a day (QID) | ORAL | Status: DC | PRN

## 2015-10-27 MED ORDER — BUPIVACAINE HCL (PF) 0.25 % IJ SOLN
INTRAMUSCULAR | Status: DC | PRN
  Administered 2015-10-27: 20 mL

## 2015-10-27 SURGICAL SUPPLY — 34 items
BLADE CUDA 5.5 (BLADE) IMPLANT
BLADE GREAT WHITE 4.2 (BLADE) IMPLANT
BNDG COHESIVE 6X5 TAN STRL LF (GAUZE/BANDAGES/DRESSINGS) ×2 IMPLANT
BNDG GAUZE ELAST 4 BULKY (GAUZE/BANDAGES/DRESSINGS) ×1 IMPLANT
BUR OVAL 6.0 (BURR) IMPLANT
COVER SURGICAL LIGHT HANDLE (MISCELLANEOUS) ×4 IMPLANT
CUFF TOURNIQUET SINGLE 34IN LL (TOURNIQUET CUFF) IMPLANT
CUFF TOURNIQUET SINGLE 44IN (TOURNIQUET CUFF) IMPLANT
DRAPE ARTHROSCOPY W/POUCH 114 (DRAPES) ×2 IMPLANT
DRAPE U-SHAPE 47X51 STRL (DRAPES) ×2 IMPLANT
DRSG EMULSION OIL 3X3 NADH (GAUZE/BANDAGES/DRESSINGS) ×2 IMPLANT
DRSG PAD ABDOMINAL 8X10 ST (GAUZE/BANDAGES/DRESSINGS) ×2 IMPLANT
DURAPREP 26ML APPLICATOR (WOUND CARE) ×2 IMPLANT
GAUZE SPONGE 4X4 12PLY STRL (GAUZE/BANDAGES/DRESSINGS) ×2 IMPLANT
GLOVE SURG ORTHO 9.0 STRL STRW (GLOVE) ×2 IMPLANT
GOWN STRL REUS W/ TWL XL LVL3 (GOWN DISPOSABLE) ×3 IMPLANT
GOWN STRL REUS W/TWL XL LVL3 (GOWN DISPOSABLE) ×6
KIT BASIN OR (CUSTOM PROCEDURE TRAY) ×2 IMPLANT
KIT ROOM TURNOVER OR (KITS) ×2 IMPLANT
MANIFOLD NEPTUNE II (INSTRUMENTS) ×2 IMPLANT
NDL 18GX1X1/2 (RX/OR ONLY) (NEEDLE) ×1 IMPLANT
NEEDLE 18GX1X1/2 (RX/OR ONLY) (NEEDLE) ×2 IMPLANT
PACK ARTHROSCOPY DSU (CUSTOM PROCEDURE TRAY) ×2 IMPLANT
PAD ARMBOARD 7.5X6 YLW CONV (MISCELLANEOUS) ×4 IMPLANT
PADDING CAST COTTON 6X4 STRL (CAST SUPPLIES) ×2 IMPLANT
SET ARTHROSCOPY TUBING (MISCELLANEOUS) ×2
SET ARTHROSCOPY TUBING LN (MISCELLANEOUS) ×1 IMPLANT
SPONGE GAUZE 4X4 12PLY STER LF (GAUZE/BANDAGES/DRESSINGS) ×1 IMPLANT
SUT ETHILON 4 0 PS 2 18 (SUTURE) ×2 IMPLANT
SYR 20CC LL (SYRINGE) ×2 IMPLANT
TOWEL OR 17X24 6PK STRL BLUE (TOWEL DISPOSABLE) ×4 IMPLANT
TUBE CONNECTING 12X1/4 (SUCTIONS) ×2 IMPLANT
WAND HAND CNTRL MULTIVAC 90 (MISCELLANEOUS) IMPLANT
WATER STERILE IRR 1000ML POUR (IV SOLUTION) ×2 IMPLANT

## 2015-10-27 NOTE — Anesthesia Preprocedure Evaluation (Addendum)
Anesthesia Evaluation  Patient identified by MRN, date of birth, ID band Patient awake    Reviewed: Allergy & Precautions, H&P , NPO status , Patient's Chart, lab work & pertinent test results  Airway Mallampati: III  TM Distance: >3 FB Neck ROM: Full    Dental no notable dental hx. (+) Teeth Intact, Dental Advisory Given   Pulmonary sleep apnea , former smoker,    Pulmonary exam normal breath sounds clear to auscultation       Cardiovascular hypertension, Pt. on medications negative cardio ROS   Rhythm:Regular Rate:Normal     Neuro/Psych negative neurological ROS  negative psych ROS   GI/Hepatic Neg liver ROS, GERD  Medicated and Controlled,  Endo/Other  diabetes, Type 2, Oral Hypoglycemic Agents  Renal/GU negative Renal ROS  negative genitourinary   Musculoskeletal  (+) Arthritis , Osteoarthritis,    Abdominal   Peds  Hematology negative hematology ROS (+)   Anesthesia Other Findings   Reproductive/Obstetrics negative OB ROS                            Anesthesia Physical Anesthesia Plan  ASA: III  Anesthesia Plan: General   Post-op Pain Management:    Induction: Intravenous  Airway Management Planned: LMA  Additional Equipment:   Intra-op Plan:   Post-operative Plan: Extubation in OR  Informed Consent: I have reviewed the patients History and Physical, chart, labs and discussed the procedure including the risks, benefits and alternatives for the proposed anesthesia with the patient or authorized representative who has indicated his/her understanding and acceptance.   Dental advisory given  Plan Discussed with: CRNA  Anesthesia Plan Comments:         Anesthesia Quick Evaluation

## 2015-10-27 NOTE — H&P (Signed)
Jeff Edwards. is an 59 y.o. male.   Chief Complaint: Osteoarthritis meniscal tear left knee HPI: Patient is a 59 year old gentleman with mechanical symptoms of his left knee he has pain with activities of daily living is failed conservative treatment and presents at this time for left knee arthroscopy.  Past Medical History  Diagnosis Date  . Hypertension   . ADHD (attention deficit hyperactivity disorder)   . GERD (gastroesophageal reflux disease)   . Cancer ([place])     basal cell carcinoma   . Memory change   . 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 Sleep apnea     cpap does not use (study done 10+ yrs) - has lost weight  . Diabetes mellitus     Type 2  . Neuropathy ([place])     in feet  . Arthritis     osteoarthritis in knees  . Anemia     as a child  . Diverticulitis   . Retinal detachment     right eye    Past Surgical History  Procedure Laterality Date  . Basal cell carcinoma excision    . Ankle fracture surgery      broken and reset   age 38s  . Knee arthroscopy  07/10/2012    Procedure: ARTHROSCOPY KNEE;  Surgeon: Newt Minion, MD;  Location: [facility];  Service: Orthopedics;  Laterality: Right;  Right knee arthroscopy and debridement, chrondralplasty  . Colonoscopy      Family History  Problem Relation Age of Onset  . Heart disease Maternal Grandfather   . Alzheimer's disease Mother   . Diabetes Sister   . High blood pressure Mother    Social History:  reports that he quit smoking about 30 years ago. He has never used smokeless tobacco. He reports that he does not drink alcohol or use illicit drugs.  Allergies:  Allergies  Allergen Reactions  . Penicillins Other (See Comments)    Passed out as a child    No prescriptions prior to admission    Results for orders placed or performed during the hospital encounter of 10/25/15 (from the past 48 hour(s))  Glucose, capillary     Status: Abnormal   Collection Time: 10/25/15  8:06 AM   Result Value Ref Range   Glucose-Capillary 226 (H) 65 - 99 mg/dL  Basic metabolic panel     Status: Abnormal   Collection Time: 10/25/15  8:43 AM  Result Value Ref Range   Sodium 138 135 - 145 mmol/L   Potassium 4.4 3.5 - 5.1 mmol/L   Chloride 100 (L) 101 - 111 mmol/L   CO2 29 22 - 32 mmol/L   Glucose, Bld 269 (H) 65 - 99 mg/dL   BUN 12 6 - 20 mg/dL   Creatinine, Ser 0.85 0.61 - 1.24 mg/dL   Calcium 9.4 8.9 - 10.3 mg/dL   GFR calc non Af Amer >60 >60 mL/min   GFR calc Af Amer >60 >60 mL/min    Comment: (NOTE) The eGFR has been calculated using the CKD EPI equation. This calculation has not been validated in all clinical situations. eGFR's persistently <60 mL/min signify possible Chronic Kidney Disease.    Anion gap 9 5 - 15  CBC     Status: None   Collection Time: 10/25/15  8:43 AM  Result Value Ref Range   WBC 9.0 4.0 - 10.5 K/uL   RBC 5.31 4.22 - 5.81 MIL/uL   Hemoglobin 14.0 13.0 - 17.0 g/dL  HCT 43.7 39.0 - 52.0 %   MCV 82.3 78.0 - 100.0 fL   MCH 26.4 26.0 - 34.0 pg   MCHC 32.0 30.0 - 36.0 g/dL   RDW 13.6 11.5 - 15.5 %   Platelets 280 150 - 400 K/uL   No results found.  Review of Systems  All other systems reviewed and are negative.   There were no vitals taken for this visit. Physical Exam  On examination patient has pain to palpation along the joint lines) cruciate are stable flexion and rotation is painful. Assessment/Plan Assessment: Arthritis with meniscal tear left knee.  Plan: We'll plan for left knee arthroscopy. Risks and benefits were discussed including persistent pain. Patient states he understands wishes to proceed at this time.  Zanylah Hardie V 10/27/2015, 6:19 AM

## 2015-10-27 NOTE — Progress Notes (Signed)
Orthopedic Tech Progress Note Patient Details:  Jeff Edwards. 1956-02-25 [id number]  Ortho Devices Type of Ortho Device: Crutches Ortho Device/Splint Interventions: Adjustment   [name] 10/27/2015, 3:46 PM

## 2015-10-27 NOTE — Anesthesia Procedure Notes (Signed)
Procedure Name: LMA Insertion Date/Time: 10/27/2015 1:11 PM Performed by: Rush Farmer E Pre-anesthesia Checklist: Patient identified, Emergency Drugs available, Suction available, Patient being monitored and Timeout performed Patient Re-evaluated:Patient Re-evaluated prior to inductionOxygen Delivery Method: Circle system utilized Preoxygenation: Pre-oxygenation with 100% oxygen Intubation Type: IV induction Ventilation: Mask ventilation without difficulty LMA: LMA inserted LMA Size: 4.0 Number of attempts: 1 Placement Confirmation: positive ETCO2 and breath sounds checked- equal and bilateral Tube secured with: Tape Dental Injury: Teeth and Oropharynx as per pre-operative assessment

## 2015-10-27 NOTE — Op Note (Signed)
10/27/2015  1:41 PM  PATIENT:  Jeff Edwards.    PRE-OPERATIVE DIAGNOSIS:  Osteoarthritis Left Knee  POST-OPERATIVE DIAGNOSIS:  Same  PROCEDURE:  Left Knee Arthroscopy and Debridement Excision medial meniscal tear #2 excision of loose body 10 mm in diameter #3 abrasion chondroplasty of the patella trochlea and medial femoral condyle.  SURGEON:  Newt Minion, MD  PHYSICIAN ASSISTANT:None ANESTHESIA:   General  PREOPERATIVE INDICATIONS:  [name]. is a  59 y.o. male with a diagnosis of Osteoarthritis Left Knee who failed conservative measures and elected for surgical management.    The risks benefits and alternatives were discussed with the patient preoperatively including but not limited to the risks of infection, bleeding, nerve injury, cardiopulmonary complications, the need for revision surgery, among others, and the patient was willing to proceed.  OPERATIVE IMPLANTS: None  OPERATIVE FINDINGS: Loose body large osteochondral defect of the medial femoral condyle with large degenerative tear of the medial meniscus.  OPERATIVE PROCEDURE: Patient brought to the operating room after undergoing the block. Patient then underwent a general anesthetic. After adequate levels anesthesia were obtained patient's left lower extremity was prepped using DuraPrep draped into a sterile field. A timeout was called. The scope was inserted through the inferior lateral portal and inferior medial working portal was established. Visualization showed a significant amount of synovitis this was debrided with a shaver. Patient had a large degenerative tear of the medial meniscus posteriorly. Using the biter this was debrided back to viable margin. The shaver was used to further debride the medial meniscus. Patient large osteochondral defect involving almost the entire medial femoral condyle. Patient underwent debridement of the loose cartilage back to bleeding viable subchondral bone. In evaluation of the  notch showed a loose body 10 mm in diameter this was removed with the shaver. Examination the lateral joint line a figure 4 position showed some mild degenerative changes of the articular cartilage as well as mild degenerative changes of the lateral meniscus this was debrided with the shaver. Further synovectomy was performed. The knee in extension the medial plica was excised further debridement of loose bodies and articular cartilage was performed in the patellofemoral joint. Survey of all compartments was then performed the incidence remove the portals were closed using 3-0 nylon. A sterile compressive dressing was applied. Patient was extubated taken to the PACU in stable condition.

## 2015-10-27 NOTE — Transfer of Care (Signed)
Immediate Anesthesia Transfer of Care Note  Patient: Jeff Edwards.  Procedure(s) Performed: Procedure(s): Left Knee Arthroscopy and Debridement (Left)  Patient Location: PACU  Anesthesia Type:GA combined with regional for post-op pain  Level of Consciousness: awake, alert  and oriented  Airway & Oxygen Therapy: Patient Spontanous Breathing and Patient connected to nasal cannula oxygen  Post-op Assessment: Report given to RN, Post -op Vital signs reviewed and stable and Patient moving all extremities X 4  Post vital signs: Reviewed and stable  Last Vitals:  Filed Vitals:   10/27/15 1145 10/27/15 1150  BP: 124/77 116/80  Pulse: 64 71  Temp:    Resp: 12 15    Complications: No apparent anesthesia complications

## 2015-10-30 ENCOUNTER — Encounter (HOSPITAL_COMMUNITY): Payer: Self-pay | Admitting: Orthopedic Surgery

## 2015-10-30 NOTE — Anesthesia Postprocedure Evaluation (Signed)
Anesthesia Post Note  Patient: Jeff Edwards.  Procedure(s) Performed: Procedure(s) (LRB): Left Knee Arthroscopy and Debridement (Left)  Patient location during evaluation: PACU Anesthesia Type: General and Regional Level of consciousness: awake and alert Pain management: pain level controlled Vital Signs Assessment: post-procedure vital signs reviewed and stable Respiratory status: spontaneous breathing Cardiovascular status: blood pressure returned to baseline Anesthetic complications: no    Last Vitals:  Filed Vitals:   10/27/15 1530 10/27/15 1540  BP: 143/94 133/80  Pulse: 62   Temp:  36.1 C  Resp: 17     Last Pain:  Filed Vitals:   10/27/15 1544  PainSc: 2                  Tiajuana Amass

## 2015-11-01 LAB — GLUCOSE, CAPILLARY: GLUCOSE-CAPILLARY: 110 mg/dL — AB (ref 65–99)

## 2015-11-24 ENCOUNTER — Ambulatory Visit (INDEPENDENT_AMBULATORY_CARE_PROVIDER_SITE_OTHER): Payer: 59 | Admitting: Ophthalmology

## 2015-11-24 DIAGNOSIS — H43813 Vitreous degeneration, bilateral: Secondary | ICD-10-CM

## 2015-11-24 DIAGNOSIS — E11319 Type 2 diabetes mellitus with unspecified diabetic retinopathy without macular edema: Secondary | ICD-10-CM | POA: Diagnosis not present

## 2015-11-24 DIAGNOSIS — H2513 Age-related nuclear cataract, bilateral: Secondary | ICD-10-CM | POA: Diagnosis not present

## 2015-11-24 DIAGNOSIS — E113293 Type 2 diabetes mellitus with mild nonproliferative diabetic retinopathy without macular edema, bilateral: Secondary | ICD-10-CM | POA: Diagnosis not present

## 2015-11-24 DIAGNOSIS — I10 Essential (primary) hypertension: Secondary | ICD-10-CM

## 2015-11-24 DIAGNOSIS — H35033 Hypertensive retinopathy, bilateral: Secondary | ICD-10-CM | POA: Diagnosis not present

## 2015-11-24 DIAGNOSIS — H35713 Central serous chorioretinopathy, bilateral: Secondary | ICD-10-CM | POA: Diagnosis not present

## 2015-11-24 MED FILL — OXYCODONE/APAP 5-325: 5-325 | 20 days supply | Qty: 40 | Fill #0

## 2015-11-24 MED FILL — ASPIR-LOW EC 81 MG TABLET: 81 | 90 days supply | Qty: 90 | Fill #0

## 2015-12-06 DIAGNOSIS — H524 Presbyopia: Secondary | ICD-10-CM | POA: Diagnosis not present

## 2015-12-08 ENCOUNTER — Ambulatory Visit: Payer: 59 | Admitting: Pharmacist

## 2015-12-11 MED FILL — METHYLPHENIDATE 10 MG TAB: 10 | 30 days supply | Qty: 60 | Fill #0

## 2015-12-15 ENCOUNTER — Encounter: Payer: Self-pay | Admitting: Pharmacist

## 2015-12-15 ENCOUNTER — Ambulatory Visit (INDEPENDENT_AMBULATORY_CARE_PROVIDER_SITE_OTHER): Payer: Self-pay | Admitting: Family Medicine

## 2015-12-15 VITALS — BP 102/68 | Wt 232.4 lb

## 2015-12-15 DIAGNOSIS — E119 Type 2 diabetes mellitus without complications: Secondary | ICD-10-CM

## 2015-12-15 MED FILL — ATORVASTATIN 20 MG TABLET: 20 | 90 days supply | Qty: 90 | Fill #0

## 2015-12-15 MED FILL — metFORMIN HCL 500 MG TABS: 500 | 90 days supply | Qty: 360 | Fill #0

## 2015-12-15 NOTE — Progress Notes (Signed)
Subjective:  Patient presents today for 3 month diabetes follow-up as part of the employer-sponsored [employer] to [employer] program.  Current diabetes regimen includes metformin 1000 mg po BID. Patient also continues on daily ASA, ACE Inhibitor and statin.  Most recent MD follow-up was with [name] in December. Patient has a pending appt for March with [name].  Last A1C was 7.2% when he last came to see me.   Patient had knee surgery in December and is still out of work. He states that since his surgery his blood sugar has been elevated. He thinks that since he is not working and therefore not walking as much that is why his blood sugar is elevated.    Assessment:  Diabetes: A1C today was 7.7% which is at exceeding goal of less than 7%. Weight is stable from last visit with me.  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    I will fax A1C results to [name].    CBG Review: He states that he is checking once daily.   CBG averages are about 20-40 points higher than at last visit with me.   The majority of his fasting readings are between 130-160 mg/dL.   High/Low- 207/100  Lifestyle improvements:  Physical Activity- He states that he has been trying to increase his walking since his surgery. He states that he can walk about a mile before he starts having pain. He estimates that he is walking daily for 45 minutes.   He is still wearing a fitbit and he states that he is getting anywhere from 6,000-12,000 steps.    Nutrition-  He states that since his surgery he has been eating more (although weight is stable since surgery). He has also been eating more carbohydrate heavy meals and snacks.   One of his last goals was to reduce consumption of concentrated sweets. He has not done that and is eating  them 4-5 itmes a week. He does states that he has been able to keep meals to 45-60 grams of carbohydrates.   He wants to make a goal this time to cut out concentrated sweets entirely. He states that it is easier to go "cold [place]" and refrain entirely.   Follow up with me in 3 months.    Plan/Goals for Next Visit:  1. Continue walking as you are able so that you can build up your exercise tolerance in preparation for going back from work.  2. Refrain from eating concentrated sweets.  3. With crackers or chips, look for individual serving sizes so that you won't over eat.     Next appointment to see me is: Friday April 28th at [facility] Donneta Romberg, PharmD, BCPS, CDE Norm Parcel to [place] Coordinator [phone]

## 2015-12-22 NOTE — Progress Notes (Signed)
I have reviewed this pharmacist's note and agree  

## 2015-12-29 MED FILL — OXYCODONE/APAP 5-325: 5-325 | 20 days supply | Qty: 40 | Fill #0

## 2016-01-03 ENCOUNTER — Ambulatory Visit: Payer: 59 | Attending: Orthopedic Surgery | Admitting: Physical Therapy

## 2016-01-03 DIAGNOSIS — Z9889 Other specified postprocedural states: Secondary | ICD-10-CM | POA: Diagnosis not present

## 2016-01-03 DIAGNOSIS — M25662 Stiffness of left knee, not elsewhere classified: Secondary | ICD-10-CM | POA: Insufficient documentation

## 2016-01-03 DIAGNOSIS — R262 Difficulty in walking, not elsewhere classified: Secondary | ICD-10-CM | POA: Diagnosis not present

## 2016-01-03 DIAGNOSIS — M25661 Stiffness of right knee, not elsewhere classified: Secondary | ICD-10-CM | POA: Insufficient documentation

## 2016-01-03 NOTE — Patient Instructions (Signed)
  Straight Leg Raise    Tighten stomach and slowly raise locked right leg _8-10___ inches from floor. Repeat __10-20__ times per set. Do __1-2__ sets per session. Do __2__ sessions per day. Do another set with toes turned out to the side.    http://orth.exer.us/1102   Copyright  VHI. All rights reserved.    KNEE: Extension (Isometric)    Press knee down and tighten front of the thighs.  Clinical research associate. All rights reserved.   Copyright  VHI. All rights reserved.  Outer Hip Stretch: Reclined IT Band Stretch (Strap)    Strap around opposite foot, pull across only as far as possible with shoulders on mat. Hold for ___30 sec _ breaths. Repeat __2-3__ times each leg.  Pull leg straight up to stretch hamstrings, 2-3 x 30 sec  Copyright  VHI. All rights reserved.

## 2016-01-03 NOTE — Therapy (Signed)
[place], [place], [address] Phone: [phone]   Fax:  [phone]  Physical Therapy Evaluation  Patient Details  Name: Jeff Edwards. MRN: [id number] Date of Birth: Jun 29, 1956 Referring Provider: Dr. Meridee Score  Encounter Date: 01/03/2016      PT End of Session - 01/03/16 1427    Visit Number 1   Number of Visits 16   Date for PT Re-Evaluation 02/28/16   PT Start Time [id number]   PT Stop Time 1429   PT Time Calculation (min) 44 min   Activity Tolerance Patient tolerated treatment well   Behavior During Therapy [facility] for tasks assessed/performed      Past Medical History  Diagnosis Date  . Hypertension   . ADHD (attention deficit hyperactivity disorder)   . GERD (gastroesophageal reflux disease)   . Cancer ([place])     basal cell carcinoma   . Memory change   . 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 Sleep apnea     cpap does not use (study done 10+ yrs) - has lost weight  . Diabetes mellitus     Type 2  . Neuropathy ([place])     in feet  . Arthritis     osteoarthritis in knees  . Anemia     as a child  . Diverticulitis   . Retinal detachment     right eye    Past Surgical History  Procedure Laterality Date  . Basal cell carcinoma excision    . Ankle fracture surgery      broken and reset   age 53s  . Knee arthroscopy  07/10/2012    Procedure: ARTHROSCOPY KNEE;  Surgeon: Newt Minion, MD;  Location: [place];  Service: Orthopedics;  Laterality: Right;  Right knee arthroscopy and debridement, chrondralplasty  . Colonoscopy    . Knee arthroscopy Left 10/27/2015    Procedure: Left Knee Arthroscopy and Debridement;  Surgeon: Newt Minion, MD;  Location: [place];  Service: Orthopedics;  Laterality: Left;    There were no vitals filed for this visit.  Visit Diagnosis:  S/P left knee arthroscopy  Knee stiffness, left  Stiffness of knee joint, right  Difficulty  walking      Subjective Assessment - 01/03/16 1349    Subjective 10/27/15 L knee arthroscopy with continued difficulty with walking, stairs, diff squatting, bending, transferring to stand from low surface.    Feels unsteady on uneven ground.  He feels that his recovery may have been hindered by the fact that his Rt. knee never really gained full function.     Pertinent History Rt. knee arthroscopy 07/10/12, bilalateral ankle surgeries, L hip labral tear (chronic) , HTN, Diabetes   Limitations Sitting;Lifting;Standing;Walking;House hold activities   How long can you stand comfortably? 30 min    How long can you walk comfortably? 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 Yes   Pain Score 2   5/10   Pain Location Knee   Pain Orientation Left;Lateral  medial occ sharp pain    Pain Descriptors / Indicators Tightness;Sore;Aching   Pain Type Chronic pain   Pain Onset More than a month ago   Pain Frequency Intermittent   Aggravating Factors  weightbearing, overacitvity    Pain Relieving Factors ice, hydrocodone, percocet   Effect of Pain on Daily Activities restricted work    Multiple Pain Sites No  [facility] PT Assessment - 01/03/16 1358    Assessment   Medical Diagnosis L knee arthroscropy    Referring Provider Dr. Meridee Score   Onset Date/Surgical Date 10/27/15   Next MD Visit Unknown   Prior Therapy No    Precautions   Precautions None   Restrictions   Weight Bearing Restrictions No   Balance Screen   Has the patient fallen in the past 6 months No   [place] residence   Living Arrangements Spouse/significant other   Home Access Stairs to enter   Entrance Stairs-Number of Steps 2   Prior Function   Level of Independence Independent   Vocation Full time employment   Vocation Requirements on [place], is transporter for patients at [employer], walks up to 20,000 steps in a day    Leisure Pets, yardwork, outdoor activties   Cognition   Overall Cognitive Status Within Functional Limits for tasks assessed   Observation/Other Assessments   Focus on Therapeutic Outcomes (FOTO)  65%   Sensation   Light Touch Not tested   Coordination   Gross Motor Movements are Fluid and Coordinated Not tested   Squat   Comments poor hip hinge   Single Leg Stance   Comments limited bilaterally <20 sec   Posture/Postural Control   Posture Comments genu varus, wide BOS with hips ER   AROM   Right Knee Extension 2   Right Knee Flexion 90   Left Knee Extension 5   Left Knee Flexion 116   PROM   Overall PROM  Deficits  IR Rt./Lt. (20, 10 deg), ER (40, 30)   Strength   Right Hip Flexion 4/5   Right Hip Extension 4/5   Right Hip ABduction 5/5   Left Hip Flexion 3+/5   Left Hip Extension 4/5   Left Hip ABduction 4+/5   Right Knee Flexion 4/5   Right Knee Extension 5/5   Left Knee Flexion 4/5   Left Knee Extension 5/5   Right Ankle Dorsiflexion 5/5   Left Ankle Dorsiflexion 5/5   Palpation   Patella mobility hypomobile Rt.>Lt   Palpation comment no pain, just sore lateral joint line   Ambulation/Gait   Ambulation Distance (Feet) 150 Feet   Assistive device None   Gait Pattern Antalgic;Wide base of support;Abducted - left;Abducted- right   Ambulation Surface Level;Indoor                   [facility] Adult PT Treatment/Exercise - 01/03/16 1419    Self-Care   Self-Care Posture   Posture gait, HEP and knee ROM, function   Knee/Hip Exercises: Stretches   Active Hamstring Stretch Both;1 rep;30 seconds   Active Hamstring Stretch Limitations HEP   ITB Stretch Both;1 rep;30 seconds   Knee/Hip Exercises: Supine   Quad Sets Strengthening;Left;1 set;10 reps   Straight Leg Raises Strengthening;Both;1 set;10 reps   Straight Leg Raise with External Rotation Strengthening;1 set;10 reps                PT Education - 01/03/16 1427    Education provided Yes    Education Details PT/POC, HEP    Person(s) Educated Patient   Methods Explanation;Demonstration;Verbal cues;Handout   Comprehension Returned demonstration;Verbalized understanding;Need further instruction          PT Short Term Goals - 01/03/16 1548    PT SHORT TERM GOAL #1   Title Pt will be I with HEP for bilateral knees (initial )   Time 4  Period Weeks   Status New   PT SHORT TERM GOAL #2   Title Pt will be able to walk in the park (30 min ) with noteably less pain in L Lateral knee (25% improved)   Time 4   Period Weeks   Status New   PT SHORT TERM GOAL #3   Title Pt will be able to report strategies for RICE, edema mgmt   Time 4   Period Weeks   Status New           PT Long Term Goals - 01/03/16 1550    PT LONG TERM GOAL #1   Title Pt will be I with more advanced HEP for bilateral knees    Time 8   Period Weeks   Status New   PT LONG TERM GOAL #2   Title Pt will be able to push/pull up to 50# with no increase in knee pain in prep for return to work.    Time 8   Period Weeks   Status New   PT LONG TERM GOAL #3   Title Pt will demo 5/5 strength in bilateral LEs for improved function at work.    Time 8   Period Weeks   Status New   PT LONG TERM GOAL #4   Title Pt will report no UE assistance needed for standing from low surface   Time 8   Period Weeks   Status New   PT LONG TERM GOAL #5   Title Pt will score <45% limited on FOTO to demo improvement and readiness for work.    Time 8   Period Weeks   Status New               Plan - 01/03/16 1542    Clinical Impression Statement Patient with low complexity eval for L knee arthroscopy done 10/27/15.  Pt with L leg mild weakness, Rt>Lt. knee stiffness and Lt Leg edema.  He wishes to return to work for which has needs to be able to walk most of the shift, pushing and pulling patients at times. He received no therapy for his Rt. knee and has really not done any exercises (exceptforwalking) since his  surgery.      Pt will benefit from skilled therapeutic intervention in order to improve on the following deficits Abnormal gait;Decreased range of motion;Difficulty walking;Increased fascial restricitons;Impaired UE functional use;Pain;Impaired flexibility;Hypomobility;Decreased balance;Decreased strength;Increased edema;Decreased mobility;Postural dysfunction   Rehab Potential Excellent   PT Frequency 3x / week  3 x 2 weeks then 2 x2 if progressing    PT Duration 8 weeks   PT Treatment/Interventions ADLs/Self Care Home Management;Ultrasound;Neuromuscular re-education;Scar mobilization;Patient/family education;Passive range of motion;Gait training;Stair training;Cryotherapy;Electrical Stimulation;Iontophoresis 4mg /ml Dexamethasone;Moist Heat;Balance training;Therapeutic exercise;Manual techniques;Therapeutic activities;Vasopneumatic Device;Taping;Functional mobility training   PT Next Visit Plan Bike v NuStep, reveiw ther ex, AROM and strength as tol    PT Home Exercise Plan ITB, HS, quad set and SLR   Consulted and Agree with Plan of Care Patient         Problem List Patient Active Problem List   Diagnosis Date Noted  . 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 Diabetes ([place]) 03/01/2013    Mavis Gravelle 01/03/2016, 3:56 PM  [facility] [address] [place], [place], [address] Phone: [phone]   Fax:  [phone]  Name: Jeff Edwards. MRN: [id number] Date of Birth: [birth year]   Raeford Razor, PT 01/03/2016 3:56 PM Phone: [phone] Fax: [phone]

## 2016-01-08 ENCOUNTER — Ambulatory Visit: Payer: 59 | Admitting: Physical Therapy

## 2016-01-08 DIAGNOSIS — R262 Difficulty in walking, not elsewhere classified: Secondary | ICD-10-CM

## 2016-01-08 DIAGNOSIS — M25662 Stiffness of left knee, not elsewhere classified: Secondary | ICD-10-CM | POA: Diagnosis not present

## 2016-01-08 DIAGNOSIS — Z9889 Other specified postprocedural states: Secondary | ICD-10-CM

## 2016-01-08 DIAGNOSIS — M25661 Stiffness of right knee, not elsewhere classified: Secondary | ICD-10-CM | POA: Diagnosis not present

## 2016-01-08 NOTE — Patient Instructions (Addendum)
Gastroc Stretch    Stand with right/Left foot back, leg straight, forward leg bent. Keeping heel on floor, turned slightly out, lean into wall until stretch is felt in calf. Hold 30____ seconds. Repeat _3___ times per set. Do _1___ sets per session. Do ___1_ sessions per day.  http://orth.exer.us/26   Copyright  VHI. All rights reserved.  IT band stretch from exIT Band: [place] with left hand on wall. Lean hip toward wall with other arm supporting trunk. Hold25__ seconds. Relax. Repeat __3_ times. Do 1___ times a day. Repeat on other side.  Copyright  VHI. All rights reserved.

## 2016-01-08 NOTE — Therapy (Signed)
[place], [place], [address] Phone: [phone]   Fax:  [phone]  Physical Therapy Treatment  Patient Details  Name: Jeff Edwards. MRN: [id number] Date of Birth: 1956/01/18 Referring Provider: Dr. Meridee Score  Encounter Date: 01/08/2016      PT End of Session - 01/08/16 1034    Visit Number 2   Number of Visits 16   Date for PT Re-Evaluation 02/28/16   PT Start Time 0845   PT Stop Time 0934   PT Time Calculation (min) 49 min   Activity Tolerance Patient tolerated treatment well   Behavior During Therapy [facility] for tasks assessed/performed      Past Medical History  Diagnosis Date  . Hypertension   . ADHD (attention deficit hyperactivity disorder)   . GERD (gastroesophageal reflux disease)   . Cancer ([place])     basal cell carcinoma   . Memory change   . 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 Sleep apnea     cpap does not use (study done 10+ yrs) - has lost weight  . Diabetes mellitus     Type 2  . Neuropathy ([place])     in feet  . Arthritis     osteoarthritis in knees  . Anemia     as a child  . Diverticulitis   . Retinal detachment     right eye    Past Surgical History  Procedure Laterality Date  . Basal cell carcinoma excision    . Ankle fracture surgery      broken and reset   age 2s  . Knee arthroscopy  07/10/2012    Procedure: ARTHROSCOPY KNEE;  Surgeon: Newt Minion, MD;  Location: [place];  Service: Orthopedics;  Laterality: Right;  Right knee arthroscopy and debridement, chrondralplasty  . Colonoscopy    . Knee arthroscopy Left 10/27/2015    Procedure: Left Knee Arthroscopy and Debridement;  Surgeon: Newt Minion, MD;  Location: [place];  Service: Orthopedics;  Laterality: Left;    There were no vitals filed for this visit.  Visit Diagnosis:  S/P left knee arthroscopy  Knee stiffness, left  Stiffness of knee joint, right  Difficulty  walking      Subjective Assessment - 01/08/16 0851    Subjective On Friday pain started 1/2 hour into 3 hours of cleaning.  Takes pain pills at night .  6/10 pain.  Found a knot of swelling noted for the first time .  (1st time for swelling)   Currently in Pain? Yes   Pain Score --  mild   Pain Location Knee   Pain Orientation Left;Lateral   Pain Descriptors / Indicators Aching;Tender;Tightness;Sharp   Pain Radiating Towards lateral knee   Aggravating Factors  walking. cleaning, pivoting on leg   Pain Relieving Factors medication,ice, elevation                         [facility] Adult PT Treatment/Exercise - 01/08/16 0900    Self-Care   Self-Care --  avoid pivoting and why   Knee/Hip Exercises: Stretches   Passive Hamstring Stretch 3 reps;30 seconds   ITB Stretch 3 reps;20 seconds   Gastroc Stretch 3 reps;30 seconds  both on step   Other Knee/Hip Stretches Straight leg out to the side with strap 3 x   Knee/Hip Exercises: Standing   Heel Raises Both;2 sets;10 reps   Heel Raises Limitations Unable to  lift high single leg   Wall Squat 10 reps   Knee/Hip Exercises: Supine   Quad Sets 10 reps  Slevin towel roll   Patellar Mobs a ll directions   Other Supine Knee/Hip Exercises isometric hamstrings3 positions 5 seconds 5X   Manual Therapy   Manual Therapy Soft tissue mobilization   Soft tissue mobilization instrument assist IT band Hamstrings                PT Education - 01/08/16 1033    Education provided Yes   Education Details IT band, calf stretch   Person(s) Educated Patient   Methods Explanation;Demonstration;Verbal cues;Handout   Comprehension Verbalized understanding;Returned demonstration          PT Short Term Goals - 01/08/16 1045    PT SHORT TERM GOAL #1   Title Pt will be I with HEP for bilateral knees (initial )   Time 4   Period Weeks   Status On-going   PT SHORT TERM GOAL #2   Title Pt will be able to walk in the park (30 min )  with noteably less pain in L Lateral knee (25% improved)   Baseline walks with pain returning at 15 minutes   Time 4   Period Weeks   Status On-going   PT SHORT TERM GOAL #3   Title Pt will be able to report strategies for RICE, edema mgmt   Baseline has equipment to do at home.  Neopreen sleeve with pockets for cold packs.   Time 4   Period Weeks   Status Achieved           PT Long Term Goals - 01/03/16 1550    PT LONG TERM GOAL #1   Title Pt will be I with more advanced HEP for bilateral knees    Time 8   Period Weeks   Status New   PT LONG TERM GOAL #2   Title Pt will be able to push/pull up to 50# with no increase in knee pain in prep for return to work.    Time 8   Period Weeks   Status New   PT LONG TERM GOAL #3   Title Pt will demo 5/5 strength in bilateral LEs for improved function at work.    Time 8   Period Weeks   Status New   PT LONG TERM GOAL #4   Title Pt will report no UE assistance needed for standing from low surface   Time 8   Period Weeks   Status New   PT LONG TERM GOAL #5   Title Pt will score <45% limited on FOTO to demo improvement and readiness for work.    Time 8   Period Weeks   Status New               Plan - 01/08/16 1044    Clinical Impression Statement Progress toward home exercise goals.  Intermittant hamstring pain with exercises,  Manual helpful.  Mild pain post session.  Declined cold, will do at home.  STG#2 met.   PT Next Visit Plan Bike v NuStep, reveiw ther ex, AROM and strength as tol    PT Home Exercise Plan ITB gastroc stretch        Problem List Patient Active Problem List   Diagnosis Date Noted  . 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 Diabetes ([place]) 03/01/2013    Jeff Edwards 01/08/2016, 10:48 AM  [name]  [place], [place], [address] Phone: [phone]   Fax:  [phone]  Name: Jeff Edwards. MRN:  [id number] Date of Birth: [birth year]    [name], PTA 01/08/2016 10:48 AM Phone: [phone] Fax: [phone]

## 2016-01-09 ENCOUNTER — Ambulatory Visit: Payer: 59 | Admitting: Physical Therapy

## 2016-01-09 DIAGNOSIS — M25662 Stiffness of left knee, not elsewhere classified: Secondary | ICD-10-CM | POA: Diagnosis not present

## 2016-01-09 DIAGNOSIS — R262 Difficulty in walking, not elsewhere classified: Secondary | ICD-10-CM | POA: Diagnosis not present

## 2016-01-09 DIAGNOSIS — Z9889 Other specified postprocedural states: Secondary | ICD-10-CM

## 2016-01-09 DIAGNOSIS — M25661 Stiffness of right knee, not elsewhere classified: Secondary | ICD-10-CM | POA: Diagnosis not present

## 2016-01-09 NOTE — Patient Instructions (Signed)
Be sure to do your stretches today.

## 2016-01-09 NOTE — Therapy (Signed)
[place], [place], [address] Phone: [phone]   Fax:  [phone]  Physical Therapy Treatment  Patient Details  Name: Jeff Edwards. MRN: [id number] Date of Birth: [birth year] Referring Provider: Dr. Meridee Score  Encounter Date: 01/09/2016      PT End of Session - 01/09/16 0807    Visit Number 3   Number of Visits 16   Date for PT Re-Evaluation 02/28/16   PT Start Time 0731   PT Stop Time 0811   PT Time Calculation (min) 40 min   Activity Tolerance Patient tolerated treatment well   Behavior During Therapy [facility] for tasks assessed/performed      Past Medical History  Diagnosis Date  . Hypertension   . ADHD (attention deficit hyperactivity disorder)   . GERD (gastroesophageal reflux disease)   . Cancer ([place])     basal cell carcinoma   . Memory change   . 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 Sleep apnea     cpap does not use (study done 10+ yrs) - has lost weight  . Diabetes mellitus     Type 2  . Neuropathy ([place])     in feet  . Arthritis     osteoarthritis in knees  . Anemia     as a child  . Diverticulitis   . Retinal detachment     right eye    Past Surgical History  Procedure Laterality Date  . Basal cell carcinoma excision    . Ankle fracture surgery      broken and reset   age 52s  . Knee arthroscopy  07/10/2012    Procedure: ARTHROSCOPY KNEE;  Surgeon: Newt Minion, MD;  Location: [place];  Service: Orthopedics;  Laterality: Right;  Right knee arthroscopy and debridement, chrondralplasty  . Colonoscopy    . Knee arthroscopy Left 10/27/2015    Procedure: Left Knee Arthroscopy and Debridement;  Surgeon: Newt Minion, MD;  Location: [place];  Service: Orthopedics;  Laterality: Left;    There were no vitals filed for this visit.  Visit Diagnosis:  S/P left knee arthroscopy  Knee stiffness, left  Stiffness of knee joint, right  Difficulty  walking      Subjective Assessment - 01/09/16 0736    Subjective Alittle sore.  Had some swelling lateral knee.  Walked with walking stick and it helped walking. Iced it when he got home.     Currently in Pain? Yes   Pain Score --  mild   Pain Location Knee   Pain Orientation Left;Lateral                         [facility] Adult PT Treatment/Exercise - 01/09/16 0743    Knee/Hip Exercises: Stretches   Quad Stretch Limitations 3 X 20 prone   Knee/Hip Exercises: Machines for Strengthening   Cybex Leg Press 3 sets 2 plates, 10 X (OLD) both   Knee/Hip Exercises: Standing   Heel Raises 1 set;10 reps  single leg RT/LT   Knee Flexion Strengthening;Right;Left;2 sets;10 reps  3 LBS    Knee/Hip Exercises: Supine   Terminal Knee Extension Limitations 10 X 2 sets , prone, both  LT posterior pain moderate   Knee/Hip Exercises: Sidelying   Hip ABduction Both;1 set;10 reps   Clams 1 set 10 reps both   Knee/Hip Exercises: Prone   Hip Extension 10 reps;1 set  each   Cryotherapy  Number Minutes Cryotherapy 10 Minutes   Cryotherapy Location Knee  both   Type of Cryotherapy --  cold pack                PT Education - 01/08/16 1033    Education provided Yes   Education Details IT band, calf stretch   Person(s) Educated Patient   Methods Explanation;Demonstration;Verbal cues;Handout   Comprehension Verbalized understanding;Returned demonstration          PT Short Term Goals - 01/08/16 1045    PT SHORT TERM GOAL #1   Title Pt will be I with HEP for bilateral knees (initial )   Time 4   Period Weeks   Status On-going   PT SHORT TERM GOAL #2   Title Pt will be able to walk in the park (30 min ) with noteably less pain in L Lateral knee (25% improved)   Baseline walks with pain returning at 15 minutes   Time 4   Period Weeks   Status On-going   PT SHORT TERM GOAL #3   Title Pt will be able to report strategies for RICE, edema mgmt   Baseline has equipment  to do at home.  Neopreen sleeve with pockets for cold packs.   Time 4   Period Weeks   Status Achieved           PT Long Term Goals - 01/03/16 1550    PT LONG TERM GOAL #1   Title Pt will be I with more advanced HEP for bilateral knees    Time 8   Period Weeks   Status New   PT LONG TERM GOAL #2   Title Pt will be able to push/pull up to 50# with no increase in knee pain in prep for return to work.    Time 8   Period Weeks   Status New   PT LONG TERM GOAL #3   Title Pt will demo 5/5 strength in bilateral LEs for improved function at work.    Time 8   Period Weeks   Status New   PT LONG TERM GOAL #4   Title Pt will report no UE assistance needed for standing from low surface   Time 8   Period Weeks   Status New   PT LONG TERM GOAL #5   Title Pt will score <45% limited on FOTO to demo improvement and readiness for work.    Time 8   Period Weeks   Status New               Plan - 01/09/16 [id number]    PT Next Visit Plan Bike v NuStep, reveiw ther ex, AROM and strength as tol .  Issue hip strengthening?     PT Home Exercise Plan continue   Consulted and Agree with Plan of Care Patient        Problem List Patient Active Problem List   Diagnosis Date Noted  . 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 Diabetes ([place]) 03/01/2013    Tyshauna Finkbiner 01/09/2016, 9:44 AM  [facility] [address] [place], [place], [address] Phone: [phone]   Fax:  [phone]  Name: Jeff Edwards. MRN: [id number] Date of Birth: [birth year]    [name], PTA 01/09/2016 9:44 AM Phone: [phone] Fax: [phone]

## 2016-01-10 ENCOUNTER — Ambulatory Visit: Payer: 59 | Admitting: Physical Therapy

## 2016-01-10 DIAGNOSIS — M25662 Stiffness of left knee, not elsewhere classified: Secondary | ICD-10-CM

## 2016-01-10 DIAGNOSIS — M25661 Stiffness of right knee, not elsewhere classified: Secondary | ICD-10-CM

## 2016-01-10 DIAGNOSIS — R262 Difficulty in walking, not elsewhere classified: Secondary | ICD-10-CM | POA: Diagnosis not present

## 2016-01-10 DIAGNOSIS — Z9889 Other specified postprocedural states: Secondary | ICD-10-CM | POA: Diagnosis not present

## 2016-01-10 NOTE — Patient Instructions (Addendum)
Single Leg Stretch Prone 10 X 10 seconds.  Prone with strap for flexion Issued from exercise drawer.

## 2016-01-10 NOTE — Therapy (Signed)
[place], [place], [address] Phone: [phone]   Fax:  [phone]  Physical Therapy Treatment  Patient Details  Name: Jeff Edwards. MRN: [id number] Date of Birth: 05-17-1956 Referring Provider: Dr. Meridee Score  Encounter Date: 01/10/2016      PT End of Session - 01/10/16 1235    Visit Number 4   Number of Visits 16   Date for PT Re-Evaluation 02/28/16   PT Start Time 0846   PT Stop Time 0933   PT Time Calculation (min) 47 min   Activity Tolerance Patient tolerated treatment well   Behavior During Therapy [facility] for tasks assessed/performed      Past Medical History  Diagnosis Date  . Hypertension   . ADHD (attention deficit hyperactivity disorder)   . GERD (gastroesophageal reflux disease)   . Cancer ([place])     basal cell carcinoma   . Memory change   . 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 Sleep apnea     cpap does not use (study done 10+ yrs) - has lost weight  . Diabetes mellitus     Type 2  . Neuropathy ([place])     in feet  . Arthritis     osteoarthritis in knees  . Anemia     as a child  . Diverticulitis   . Retinal detachment     right eye    Past Surgical History  Procedure Laterality Date  . Basal cell carcinoma excision    . Ankle fracture surgery      broken and reset   age 39s  . Knee arthroscopy  07/10/2012    Procedure: ARTHROSCOPY KNEE;  Surgeon: Newt Minion, MD;  Location: [place];  Service: Orthopedics;  Laterality: Right;  Right knee arthroscopy and debridement, chrondralplasty  . Colonoscopy    . Knee arthroscopy Left 10/27/2015    Procedure: Left Knee Arthroscopy and Debridement;  Surgeon: Newt Minion, MD;  Location: [place];  Service: Orthopedics;  Laterality: Left;    There were no vitals filed for this visit.  Visit Diagnosis:  S/P left knee arthroscopy  Knee stiffness, left  Stiffness of knee joint, right  Difficulty  walking      Subjective Assessment - 01/10/16 0846    Subjective Sore.  Walking as far as usuall.  I think the bike is what I want to do today.  I really want to work on motion and break up scar tissue.   Currently in Pain? Yes   Pain Score --  LT 1/10,  RTR 2-3/10 while on bike   Pain Location Knee  posterior RT knee   Pain Orientation Right;Left   Pain Descriptors / Indicators Aching  sore            [facility] PT Assessment - 01/10/16 0001    AROM   Right Knee Flexion 100   Left Knee Flexion 113                     [facility] Adult PT Treatment/Exercise - 01/10/16 0849    Knee/Hip Exercises: Stretches   Quad Stretch Limitations Strap used 10 X 10 seconds each knee.  Patient interested in getting a stretch strap for home use.    Other Knee/Hip Stretches Demonstrated how to work on flexion sitting with planting foot after maximum flexion, then shifting hips forward.  over toes to stretch knee   Knee/Hip Exercises: Aerobic  Recumbent Bike 8 minutes   Knee/Hip Exercises: Supine   Heel Slides 10 reps   Manual Therapy   Manual therapy comments Strap mobilization for knee flexion both.     Soft tissue mobilization instrument assist posterior hamstrings, knees to decrease pain                PT Education - 01/10/16 1232    Education provided Yes   Education Details quad stretch   Person(s) Educated Patient   Methods Explanation;Demonstration;Verbal cues;Handout   Comprehension Verbalized understanding;Returned demonstration          PT Short Term Goals - 01/08/16 1045    PT SHORT TERM GOAL #1   Title Pt will be I with HEP for bilateral knees (initial )   Time 4   Period Weeks   Status On-going   PT SHORT TERM GOAL #2   Title Pt will be able to walk in the park (30 min ) with noteably less pain in L Lateral knee (25% improved)   Baseline walks with pain returning at 15 minutes   Time 4   Period Weeks   Status On-going   PT SHORT TERM GOAL #3    Title Pt will be able to report strategies for RICE, edema mgmt   Baseline has equipment to do at home.  Neopreen sleeve with pockets for cold packs.   Time 4   Period Weeks   Status Achieved           PT Long Term Goals - 01/03/16 1550    PT LONG TERM GOAL #1   Title Pt will be I with more advanced HEP for bilateral knees    Time 8   Period Weeks   Status New   PT LONG TERM GOAL #2   Title Pt will be able to push/pull up to 50# with no increase in knee pain in prep for return to work.    Time 8   Period Weeks   Status New   PT LONG TERM GOAL #3   Title Pt will demo 5/5 strength in bilateral LEs for improved function at work.    Time 8   Period Weeks   Status New   PT LONG TERM GOAL #4   Title Pt will report no UE assistance needed for standing from low surface   Time 8   Period Weeks   Status New   PT LONG TERM GOAL #5   Title Pt will score <45% limited on FOTO to demo improvement and readiness for work.    Time 8   Period Weeks   Status New               Plan - 01/10/16 1236    Clinical Impression Statement ROM improved post manual. Progress toward home exercise goals.  Walking distance not yet improved.   PT Next Visit Plan Stretching, Hip strengthening,  closed chain.   PT Home Exercise Plan prone quad stretch   Consulted and Agree with Plan of Care Patient        Problem List Patient Active Problem List   Diagnosis Date Noted  . 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 Diabetes ([place]) 03/01/2013    HARRIS,KAREN 01/10/2016, 12:41 PM  [facility] [address] [place], [place], [address] Phone: [phone]   Fax:  [phone]  Name: Jeff Edwards. MRN: [id number] Date of Birth: 1956/01/22    [name], PTA 01/10/2016  12:41 PM Phone: [phone] Fax: [phone]

## 2016-01-15 ENCOUNTER — Encounter: Payer: 59 | Admitting: Physical Therapy

## 2016-01-16 ENCOUNTER — Ambulatory Visit: Payer: 59 | Admitting: Physical Therapy

## 2016-01-16 DIAGNOSIS — M25662 Stiffness of left knee, not elsewhere classified: Secondary | ICD-10-CM

## 2016-01-16 DIAGNOSIS — Z9889 Other specified postprocedural states: Secondary | ICD-10-CM | POA: Diagnosis not present

## 2016-01-16 DIAGNOSIS — R262 Difficulty in walking, not elsewhere classified: Secondary | ICD-10-CM | POA: Diagnosis not present

## 2016-01-16 DIAGNOSIS — M25661 Stiffness of right knee, not elsewhere classified: Secondary | ICD-10-CM | POA: Diagnosis not present

## 2016-01-16 MED FILL — METHYLPHENIDATE 10 MG TAB: 10 | 30 days supply | Qty: 60 | Fill #0

## 2016-01-16 NOTE — Therapy (Signed)
[place], [place], [address] Phone: [phone]   Fax:  [phone]  Physical Therapy Treatment  Patient Details  Name: Jeff Edwards. MRN: [id number] Date of Birth: 1956-03-30 Referring Provider: Dr. Meridee Score  Encounter Date: 01/16/2016      PT End of Session - 01/16/16 1238    Visit Number 5   Number of Visits 16   Date for PT Re-Evaluation 02/28/16   PT Start Time 1147   PT Stop Time 5056   PT Time Calculation (min) 48 min   Activity Tolerance Patient tolerated treatment well   Behavior During Therapy [facility] for tasks assessed/performed      Past Medical History  Diagnosis Date  . Hypertension   . ADHD (attention deficit hyperactivity disorder)   . GERD (gastroesophageal reflux disease)   . Cancer ([place])     basal cell carcinoma   . Memory change   . 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 Sleep apnea     cpap does not use (study done 10+ yrs) - has lost weight  . Diabetes mellitus     Type 2  . Neuropathy ([place])     in feet  . Arthritis     osteoarthritis in knees  . Anemia     as a child  . Diverticulitis   . Retinal detachment     right eye    Past Surgical History  Procedure Laterality Date  . Basal cell carcinoma excision    . Ankle fracture surgery      broken and reset   age 62s  . Knee arthroscopy  07/10/2012    Procedure: ARTHROSCOPY KNEE;  Surgeon: Newt Minion, MD;  Location: [place];  Service: Orthopedics;  Laterality: Right;  Right knee arthroscopy and debridement, chrondralplasty  . Colonoscopy    . Knee arthroscopy Left 10/27/2015    Procedure: Left Knee Arthroscopy and Debridement;  Surgeon: Newt Minion, MD;  Location: [place];  Service: Orthopedics;  Laterality: Left;    There were no vitals filed for this visit.  Visit Diagnosis:  S/P left knee arthroscopy  Knee stiffness, left  Stiffness of knee joint, right  Difficulty  walking      Subjective Assessment - 01/16/16 1155    Subjective 3/10-4/10.  Had to travel this weekend, cramped up in the car.  Does his HEP.    Currently in Pain? Yes   Pain Score 4    Pain Location Knee   Pain Orientation Right;Left   Pain Descriptors / Indicators Aching   Pain Type Chronic pain   Pain Onset More than a month ago   Pain Frequency Intermittent                         [facility] Adult PT Treatment/Exercise - 01/16/16 1203    Ambulation/Gait   Ambulation Distance (Feet) 300 Feet   Assistive device Straight cane   Gait Pattern Step-through pattern   Ambulation Surface Level;Indoor   Stairs Yes   Stairs Assistance 6: Modified independent (Device/Increase time)   Number of Stairs 12   Gait Comments worked on walking with cane and practicing with cane    Knee/Hip Exercises: Stretches   Active Hamstring Stretch Both;2 reps;30 seconds   Quad Stretch Both;2 reps;30 seconds   Quad Stretch Limitations strap   ITB Stretch Both;2 reps;30 seconds   Piriformis Stretch Both;2 reps;30 seconds   Knee/Hip  Exercises: Aerobic   Recumbent Bike 6 min for AAROM and endurance   Knee/Hip Exercises: Machines for Strengthening   Cybex Leg Press 3 sets 1-2 plates with various positions   calf raise and stretch 1 plate   Knee/Hip Exercises: Standing   Other Standing Knee Exercises attempted standing hip abd sliding leg up walll, unable to do without trunk compensation    Knee/Hip Exercises: Sidelying   Hip ABduction Strengthening;Both;2 sets;10 reps   Clams x 20    Other Sidelying Knee/Hip Exercises sidekick (FW and back )   x 10    Knee/Hip Exercises: Prone   Hip Extension --                PT Education - 01/16/16 1246    Education provided Yes   Education Details hip abduction and exercises, gait with cane    Person(s) Educated Patient   Methods Explanation;Demonstration;Verbal cues;Handout   Comprehension Verbalized understanding;Returned  demonstration          PT Short Term Goals - 01/16/16 1248    PT SHORT TERM GOAL #1   Title Pt will be I with HEP for bilateral knees (initial )   Status Achieved   PT SHORT TERM GOAL #2   Title Pt will be able to walk in the park (30 min ) with noteably less pain in L Lateral knee (25% improved)   Status On-going   PT SHORT TERM GOAL #3   Title Pt will be able to report strategies for RICE, edema mgmt   Status Achieved           PT Long Term Goals - 01/16/16 1248    PT LONG TERM GOAL #1   Title Pt will be I with more advanced HEP for bilateral knees    Status On-going   PT LONG TERM GOAL #2   Title Pt will be able to push/pull up to 50# with no increase in knee pain in prep for return to work.    Status On-going   PT LONG TERM GOAL #3   Title Pt will demo 5/5 strength in bilateral LEs for improved function at work.    Status On-going   PT LONG TERM GOAL #4   Title Pt will report no UE assistance needed for standing from low surface   Status On-going   PT LONG TERM GOAL #5   Title Pt will score <45% limited on FOTO to demo improvement and readiness for work.    Status On-going               Plan - 01/16/16 1247    Clinical Impression Statement Worked on hip abd today to address gait abnormality.  Plans to use the cane he borrowed when walking >30 min to ease pain.  No goals met further.    PT Next Visit Plan Stretching, Hip strengthening,  closed chain.   PT Home Exercise Plan prone quad stretch and hip abd sidelying, hamstring, ITB    Consulted and Agree with Plan of Care Patient        Problem List Patient Active Problem List   Diagnosis Date Noted  . 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 Diabetes ([place]) 03/01/2013    PAA,JENNIFER 01/16/2016, 12:49 PM  [facility] [address] [place], [place], [address] Phone: [phone]   Fax:  [phone]  Name: Jeff Edwards. MRN: [id number] Date of Birth: 11/27/1955    Raeford Razor,  PT 01/16/2016 12:50 PM Phone: [phone] Fax: [phone]

## 2016-01-16 NOTE — Patient Instructions (Signed)
Abduction: Clam (Eccentric) - Side-Lying    Lie on side with knees bent. Lift top knee, keeping feet together. Keep trunk steady. Slowly lower for 3-5 seconds. _10-20__ reps per set, _1__ sets per day, _5__ days per week.  http://ecce.exer.us/64   Copyright  VHI. All rights reserved.   Hip Abduction: Side-Lying (Single Leg)   http://tub.exer.us/209   Copyright  VHI. All rights reserved.   Side-lying, tubing around ankles, raise top leg, keeping knee straight. Repeat  10-20__ times per set. Repeat with other leg. Do _1_ sets per session. Do _5_ sessions per week.     Hip Stretch    Sit on chair, one foot on floor. Bend other leg and place ankle on knee. Flex hip and spine until stretch is achieved in the hip of the crossed leg. Hold _30__ seconds. Do _1_ sets of __2-3_ repetitions.  Copyright  VHI. All rights reserved.     Piriformis Stretch    Lying on back, pull right knee toward opposite shoulder. Hold _30___ seconds. Repeat __2-3__ times. Do __1__ sessions per day.  http://gt2.exer.us/257   Copyright  VHI. All rights reserved.

## 2016-01-17 ENCOUNTER — Ambulatory Visit: Payer: 59 | Attending: Orthopedic Surgery | Admitting: Physical Therapy

## 2016-01-17 DIAGNOSIS — E1149 Type 2 diabetes mellitus with other diabetic neurological complication: Secondary | ICD-10-CM | POA: Diagnosis not present

## 2016-01-17 DIAGNOSIS — Z9889 Other specified postprocedural states: Secondary | ICD-10-CM | POA: Diagnosis not present

## 2016-01-17 DIAGNOSIS — Z7984 Long term (current) use of oral hypoglycemic drugs: Secondary | ICD-10-CM | POA: Diagnosis not present

## 2016-01-17 DIAGNOSIS — I10 Essential (primary) hypertension: Secondary | ICD-10-CM | POA: Diagnosis not present

## 2016-01-17 DIAGNOSIS — R262 Difficulty in walking, not elsewhere classified: Secondary | ICD-10-CM | POA: Insufficient documentation

## 2016-01-17 DIAGNOSIS — M25661 Stiffness of right knee, not elsewhere classified: Secondary | ICD-10-CM | POA: Insufficient documentation

## 2016-01-17 DIAGNOSIS — Z125 Encounter for screening for malignant neoplasm of prostate: Secondary | ICD-10-CM | POA: Diagnosis not present

## 2016-01-17 DIAGNOSIS — M25662 Stiffness of left knee, not elsewhere classified: Secondary | ICD-10-CM | POA: Diagnosis not present

## 2016-01-17 DIAGNOSIS — E782 Mixed hyperlipidemia: Secondary | ICD-10-CM | POA: Diagnosis not present

## 2016-01-17 NOTE — Therapy (Signed)
[place], [place], [address] Phone: [phone]   Fax:  [phone]  Physical Therapy Treatment  Patient Details  Name: Jeff Edwards. MRN: [id number] Date of Birth: 1956/06/15 Referring Provider: Dr. Meridee Score  Encounter Date: 01/17/2016      PT End of Session - 01/17/16 0953    Visit Number 6   Number of Visits 16   Date for PT Re-Evaluation 02/28/16   PT Start Time 0925   PT Stop Time 1011   PT Time Calculation (min) 46 min   Activity Tolerance Patient tolerated treatment well   Behavior During Therapy [facility] for tasks assessed/performed      Past Medical History  Diagnosis Date  . Hypertension   . ADHD (attention deficit hyperactivity disorder)   . GERD (gastroesophageal reflux disease)   . Cancer ([place])     basal cell carcinoma   . Memory change   . 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 Sleep apnea     cpap does not use (study done 10+ yrs) - has lost weight  . Diabetes mellitus     Type 2  . Neuropathy ([place])     in feet  . Arthritis     osteoarthritis in knees  . Anemia     as a child  . Diverticulitis   . Retinal detachment     right eye    Past Surgical History  Procedure Laterality Date  . Basal cell carcinoma excision    . Ankle fracture surgery      broken and reset   age 89s  . Knee arthroscopy  07/10/2012    Procedure: ARTHROSCOPY KNEE;  Surgeon: Newt Minion, MD;  Location: [place];  Service: Orthopedics;  Laterality: Right;  Right knee arthroscopy and debridement, chrondralplasty  . Colonoscopy    . Knee arthroscopy Left 10/27/2015    Procedure: Left Knee Arthroscopy and Debridement;  Surgeon: Newt Minion, MD;  Location: [place];  Service: Orthopedics;  Laterality: Left;    There were no vitals filed for this visit.  Visit Diagnosis:  S/P left knee arthroscopy  Knee stiffness, left  Stiffness of knee joint, right  Difficulty  walking      Subjective Assessment - 01/17/16 0939    Subjective a little less pain today, 2/.10-3/10 see below.            [facility] Adult PT Treatment/Exercise - 01/17/16 0936    Knee/Hip Exercises: Stretches   Active Hamstring Stretch Both;2 reps;30 seconds   Active Hamstring Stretch Limitations strap    Quad Stretch Both;2 reps;30 seconds   Quad Stretch Limitations strap   Knee/Hip Exercises: Aerobic   Nustep 5 min L 8 LEs only    Knee/Hip Exercises: Standing   Lateral Step Up Both;1 set;10 reps;Hand Hold: 1   Forward Step Up Both;1 set;20 reps;Hand Hold: 1   Functional Squat --   Wall Squat 2 sets;10 reps   Wall Squat Limitations added hold time with last reps, heel tap and toe taps x 10    SLS with Vectors hip abd x 10 on step each LE    Other Standing Knee Exercises lateral walking with blue band around ankles    Knee/Hip Exercises: Supine   Bridges with Clamshell Strengthening;Both;1 set;10 reps  blue band    Knee/Hip Exercises: Sidelying   Hip ABduction --   Clams x 20   with blue band    Knee/Hip  Exercises: Prone   Hamstring Curl 1 set;15 reps   Hamstring Curl Limitations 5 lbs    Hip Extension Strengthening;Both;1 set   Hip Extension Limitations tight ant hip                 PT Education - 01/17/16 0950    Education provided Yes   Education Details hip exercises   Person(s) Educated Patient   Methods Explanation;Demonstration;Verbal cues   Comprehension Verbalized understanding;Returned demonstration          PT Short Term Goals - 01/16/16 1248    PT SHORT TERM GOAL #1   Title Pt will be I with HEP for bilateral knees (initial )   Status Achieved   PT SHORT TERM GOAL #2   Title Pt will be able to walk in the park (30 min ) with noteably less pain in L Lateral knee (25% improved)   Status On-going   PT SHORT TERM GOAL #3   Title Pt will be able to report strategies for RICE, edema mgmt   Status Achieved           PT Long Term Goals -  01/16/16 1248    PT LONG TERM GOAL #1   Title Pt will be I with more advanced HEP for bilateral knees    Status On-going   PT LONG TERM GOAL #2   Title Pt will be able to push/pull up to 50# with no increase in knee pain in prep for return to work.    Status On-going   PT LONG TERM GOAL #3   Title Pt will demo 5/5 strength in bilateral LEs for improved function at work.    Status On-going   PT LONG TERM GOAL #4   Title Pt will report no UE assistance needed for standing from low surface   Status On-going   PT LONG TERM GOAL #5   Title Pt will score <45% limited on FOTO to demo improvement and readiness for work.    Status On-going               Plan - 01/17/16 0954    Clinical Impression Statement Cont to progress towards goals, addressing LE alignment and hip flexibility.    PT Next Visit Plan Stretching, Hip strengthening,  closed chain. MD note on 01/24/16   PT Home Exercise Plan prone quad stretch and hip abd sidelying, clam , hamstring, ITB    Consulted and Agree with Plan of Care Patient        Problem List Patient Active Problem List   Diagnosis Date Noted  . 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 Diabetes ([place]) 03/01/2013    Melo Stauber 01/17/2016, 10:07 AM  [facility] [address] [place], [place], [address] Phone: [phone]   Fax:  [phone]  Name: Jeff Edwards. MRN: [id number] Date of Birth: 03/18/1956  Raeford Razor, PT 01/17/2016 10:07 AM Phone: [phone] Fax: [phone]

## 2016-01-18 MED FILL — GLIMEPIRIDE 1 MG TABLET: 1 | 90 days supply | Qty: 90 | Fill #0

## 2016-01-19 ENCOUNTER — Ambulatory Visit: Payer: 59 | Admitting: Physical Therapy

## 2016-01-19 DIAGNOSIS — Z9889 Other specified postprocedural states: Secondary | ICD-10-CM

## 2016-01-19 DIAGNOSIS — M25661 Stiffness of right knee, not elsewhere classified: Secondary | ICD-10-CM | POA: Diagnosis not present

## 2016-01-19 DIAGNOSIS — M25662 Stiffness of left knee, not elsewhere classified: Secondary | ICD-10-CM

## 2016-01-19 DIAGNOSIS — R262 Difficulty in walking, not elsewhere classified: Secondary | ICD-10-CM | POA: Diagnosis not present

## 2016-01-19 NOTE — Therapy (Signed)
[place], [place], [address] Phone: [phone]   Fax:  [phone]  Physical Therapy Treatment  Patient Details  Name: Jeff Edwards. MRN: [id number] Date of Birth: 1956/11/14 Referring Provider: Dr. Meridee Score  Encounter Date: 01/19/2016      PT End of Session - 01/19/16 0850    Visit Number 7   Number of Visits 16   Date for PT Re-Evaluation 02/28/16   PT Start Time 0846   PT Stop Time 0941   PT Time Calculation (min) 55 min   Activity Tolerance Patient tolerated treatment well   Behavior During Therapy [facility] for tasks assessed/performed      Past Medical History  Diagnosis Date  . Hypertension   . ADHD (attention deficit hyperactivity disorder)   . GERD (gastroesophageal reflux disease)   . Cancer ([place])     basal cell carcinoma   . Memory change   . 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 Sleep apnea     cpap does not use (study done 10+ yrs) - has lost weight  . Diabetes mellitus     Type 2  . Neuropathy ([place])     in feet  . Arthritis     osteoarthritis in knees  . Anemia     as a child  . Diverticulitis   . Retinal detachment     right eye    Past Surgical History  Procedure Laterality Date  . Basal cell carcinoma excision    . Ankle fracture surgery      broken and reset   age 48s  . Knee arthroscopy  07/10/2012    Procedure: ARTHROSCOPY KNEE;  Surgeon: Newt Minion, MD;  Location: [place];  Service: Orthopedics;  Laterality: Right;  Right knee arthroscopy and debridement, chrondralplasty  . Colonoscopy    . Knee arthroscopy Left 10/27/2015    Procedure: Left Knee Arthroscopy and Debridement;  Surgeon: Newt Minion, MD;  Location: [place];  Service: Orthopedics;  Laterality: Left;    There were no vitals filed for this visit.  Visit Diagnosis:  S/P left knee arthroscopy  Knee stiffness, left  Stiffness of knee joint, right  Difficulty  walking      Subjective Assessment - 01/19/16 0849    Subjective doing well today, a little puffy on the sid eof my L knee.  Pain is min, like a achy, toothache, see below for full assessment.    Currently in Pain? Yes   Pain Score 3             [facility] Adult PT Treatment/Exercise - 01/19/16 0851    Knee/Hip Exercises: Aerobic   Nustep 5 min L 8 LEs only    Knee/Hip Exercises: Machines for Strengthening   Cybex Knee Extension 2 sets x 10 reps, 25 lbs, 35lbs    Cybex Knee Flexion 2 sets x 10 , 25 lbs and 35 lbs    Other Machine Omega single leg press   35-45 lbs 2 x 10 each cues for alignment    Knee/Hip Exercises: Supine   Bridges Limitations x 20    Other Supine Knee/Hip Exercises 4 way SLR bilaterally x 10-15 reps   2.5 lbs each    Cryotherapy   Number Minutes Cryotherapy 10 Minutes   Cryotherapy Location Knee   Type of Cryotherapy Ice pack   Manual Therapy   Manual Therapy Taping   Kinesiotex Edema  PT Short Term Goals - 01/19/16 0856    PT SHORT TERM GOAL #1   Title Pt will be I with HEP for bilateral knees (initial )   Status Achieved   PT SHORT TERM GOAL #2   Title Pt will be able to walk in the park (30 min ) with noteably less pain in L Lateral knee (25% improved)   Status On-going   PT SHORT TERM GOAL #3   Title Pt will be able to report strategies for RICE, edema mgmt   Status Achieved           PT Long Term Goals - 01/19/16 0857    PT LONG TERM GOAL #1   Title Pt will be I with more advanced HEP for bilateral knees    Status On-going   PT LONG TERM GOAL #2   Title Pt will be able to push/pull up to 50# with no increase in knee pain in prep for return to work.    Status On-going   PT LONG TERM GOAL #3   Title Pt will demo 5/5 strength in bilateral LEs for improved function at work.    Status On-going   PT LONG TERM GOAL #4   Title Pt will report no UE assistance needed for standing from low surface   Status On-going    PT LONG TERM GOAL #5   Title Pt will score <45% limited on FOTO to demo improvement and readiness for work.    Status On-going               Plan - 01/19/16 1051    Clinical Impression Statement Patient able to exercise without increasing pain today.  Trial of K tape for edema, lateral L knee.    PT Next Visit Plan Stretching, Hip strengthening,  closed chain. MD note on 01/24/16 How did tape work   PT [place] prone quad stretch and hip abd sidelying, clam , hamstring, ITB    Consulted and Agree with Plan of Care Patient        Problem List Patient Active Problem List   Diagnosis Date Noted  . 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 Diabetes ([place]) 03/01/2013    PAA,JENNIFER 01/19/2016, 10:52 AM  [facility] [address] [place], [place], [address] Phone: [phone]   Fax:  [phone]  Name: Jeff Edwards. MRN: [id number] Date of Birth: 1955/12/31  Raeford Razor, PT 01/19/2016 10:52 AM Phone: [phone] Fax: [phone]

## 2016-01-22 ENCOUNTER — Ambulatory Visit: Payer: 59 | Admitting: Physical Therapy

## 2016-01-22 DIAGNOSIS — R262 Difficulty in walking, not elsewhere classified: Secondary | ICD-10-CM

## 2016-01-22 DIAGNOSIS — M25662 Stiffness of left knee, not elsewhere classified: Secondary | ICD-10-CM

## 2016-01-22 DIAGNOSIS — Z9889 Other specified postprocedural states: Secondary | ICD-10-CM | POA: Diagnosis not present

## 2016-01-22 DIAGNOSIS — M25661 Stiffness of right knee, not elsewhere classified: Secondary | ICD-10-CM

## 2016-01-22 NOTE — Therapy (Signed)
[place], [place], [address] Phone: [phone]   Fax:  [phone]  Physical Therapy Treatment  Patient Details  Name: Jeff Edwards. MRN: [id number] Date of Birth: [birth year] Referring Provider: Dr. Meridee Score  Encounter Date: 01/22/2016      PT End of Session - 01/22/16 0827    Visit Number 8   Number of Visits 16   Date for PT Re-Evaluation 02/28/16   PT Start Time 0800   PT Stop Time 0851   PT Time Calculation (min) 51 min   Activity Tolerance Patient tolerated treatment well      Past Medical History  Diagnosis Date  . Hypertension   . ADHD (attention deficit hyperactivity disorder)   . GERD (gastroesophageal reflux disease)   . Cancer ([place])     basal cell carcinoma   . Memory change   . 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 Sleep apnea     cpap does not use (study done 10+ yrs) - has lost weight  . Diabetes mellitus     Type 2  . Neuropathy ([place])     in feet  . Arthritis     osteoarthritis in knees  . Anemia     as a child  . Diverticulitis   . Retinal detachment     right eye    Past Surgical History  Procedure Laterality Date  . Basal cell carcinoma excision    . Ankle fracture surgery      broken and reset   age 66s  . Knee arthroscopy  07/10/2012    Procedure: ARTHROSCOPY KNEE;  Surgeon: Newt Minion, MD;  Location: [facility];  Service: Orthopedics;  Laterality: Right;  Right knee arthroscopy and debridement, chrondralplasty  . Colonoscopy    . Knee arthroscopy Left 10/27/2015    Procedure: Left Knee Arthroscopy and Debridement;  Surgeon: Newt Minion, MD;  Location: [facility];  Service: Orthopedics;  Laterality: Left;    There were no vitals filed for this visit.  Visit Diagnosis:  S/P left knee arthroscopy  Knee stiffness, left  Stiffness of knee joint, right  Difficulty walking      Subjective Assessment - 01/22/16 0805    Subjective  4/10-5/10 today, was increased all weekend, tried walking a little with the dogs and it hurts after 1/2 mile (10-15 Min)   Currently in Pain? Yes   Pain Score 5    Pain Location Knee   Pain Orientation Left   Pain Descriptors / Indicators Aching   Pain Type Chronic pain   Pain Radiating Towards lateral knee   Pain Onset More than a month ago   Pain Frequency Intermittent                [facility] Adult PT Treatment/Exercise - 01/22/16 0807    Knee/Hip Exercises: Stretches   Active Hamstring Stretch Both;2 reps;30 seconds   ITB Stretch Both;2 reps;30 seconds   Piriformis Stretch Both;2 reps;30 seconds   Knee/Hip Exercises: Aerobic   Nustep 6 min LE and UE level 6    Knee/Hip Exercises: Supine   Straight Leg Raises Strengthening;Both;1 set;10 reps   Straight Leg Raise with External Rotation Strengthening;Both;1 set;10 reps   Other Supine Knee/Hip Exercises combo hip flex, hip abd with SLR    Moist Heat Therapy   Number Minutes Moist Heat 15 Minutes   Moist Heat Location Knee   Electrical Stimulation   Electrical Stimulation Location L  post knee   Electrical Stimulation Action IFC   Electrical Stimulation Parameters to tol (22)   Electrical Stimulation Goals Pain                PT Education - 01/22/16 [phone]    Education provided Yes   Education Details heat and IFC   Person(s) Educated Patient   Methods Explanation   Comprehension Verbalized understanding          PT Short Term Goals - 01/19/16 0856    PT SHORT TERM GOAL #1   Title Pt will be I with HEP for bilateral knees (initial )   Status Achieved   PT SHORT TERM GOAL #2   Title Pt will be able to walk in the park (30 min ) with noteably less pain in L Lateral knee (25% improved)   Status On-going   PT SHORT TERM GOAL #3   Title Pt will be able to report strategies for RICE, edema mgmt   Status Achieved           PT Long Term Goals - 01/19/16 0857    PT LONG TERM GOAL #1   Title Pt will be I  with more advanced HEP for bilateral knees    Status On-going   PT LONG TERM GOAL #2   Title Pt will be able to push/pull up to 50# with no increase in knee pain in prep for return to work.    Status On-going   PT LONG TERM GOAL #3   Title Pt will demo 5/5 strength in bilateral LEs for improved function at work.    Status On-going   PT LONG TERM GOAL #4   Title Pt will report no UE assistance needed for standing from low surface   Status On-going   PT LONG TERM GOAL #5   Title Pt will score <45% limited on FOTO to demo improvement and readiness for work.    Status On-going               Plan - 01/22/16 [id number]    Clinical Impression Statement Pt with increased pain today.  No change yet in his ability to stand with respect to pain.  He does not feel he is ready to return to full work duty.  Tape didnt really help that much.    PT Next Visit Plan Stretching, Hip strengthening,  closed chain. MD note on 01/24/16.   PT Home Exercise Plan prone quad stretch and hip abd sidelying, clam , hamstring, ITB    Consulted and Agree with Plan of Care Patient        Problem List Patient Active Problem List   Diagnosis Date Noted  . 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 Diabetes ([place]) 03/01/2013    PAA,JENNIFER 01/22/2016, 8:39 AM  [facility] [address] [place], [place], [address] Phone: [phone]   Fax:  [phone]  Name: Jeff Edwards. MRN: [id number] Date of Birth: [birth year]   Raeford Razor, PT 01/22/2016 8:39 AM Phone: [phone] Fax: [phone]

## 2016-01-24 ENCOUNTER — Ambulatory Visit: Payer: 59 | Admitting: Physical Therapy

## 2016-01-24 DIAGNOSIS — Z9889 Other specified postprocedural states: Secondary | ICD-10-CM

## 2016-01-24 DIAGNOSIS — M25662 Stiffness of left knee, not elsewhere classified: Secondary | ICD-10-CM | POA: Diagnosis not present

## 2016-01-24 DIAGNOSIS — M25661 Stiffness of right knee, not elsewhere classified: Secondary | ICD-10-CM

## 2016-01-24 DIAGNOSIS — R262 Difficulty in walking, not elsewhere classified: Secondary | ICD-10-CM

## 2016-01-24 NOTE — Therapy (Signed)
[place], [place], [address] Phone: [phone]   Fax:  [phone]  Physical Therapy Treatment  Patient Details  Name: Jeff Edwards. MRN: [id number] Date of Birth: 02-16-1956 Referring Provider: Dr. Meridee Score  Encounter Date: 01/24/2016      PT End of Session - 01/24/16 0926    Visit Number 9   Number of Visits 16   Date for PT Re-Evaluation 02/28/16   PT Start Time 0845   PT Stop Time 0939   PT Time Calculation (min) 54 min   Activity Tolerance Patient tolerated treatment well   Behavior During Therapy [facility] for tasks assessed/performed      Past Medical History  Diagnosis Date  . Hypertension   . ADHD (attention deficit hyperactivity disorder)   . GERD (gastroesophageal reflux disease)   . Cancer ([place])     basal cell carcinoma   . Memory change   . 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 Sleep apnea     cpap does not use (study done 10+ yrs) - has lost weight  . Diabetes mellitus     Type 2  . Neuropathy ([place])     in feet  . Arthritis     osteoarthritis in knees  . Anemia     as a child  . Diverticulitis   . Retinal detachment     right eye    Past Surgical History  Procedure Laterality Date  . Basal cell carcinoma excision    . Ankle fracture surgery      broken and reset   age 50s  . Knee arthroscopy  07/10/2012    Procedure: ARTHROSCOPY KNEE;  Surgeon: Newt Minion, MD;  Location: [facility];  Service: Orthopedics;  Laterality: Right;  Right knee arthroscopy and debridement, chrondralplasty  . Colonoscopy    . Knee arthroscopy Left 10/27/2015    Procedure: Left Knee Arthroscopy and Debridement;  Surgeon: Newt Minion, MD;  Location: [facility];  Service: Orthopedics;  Laterality: Left;    There were no vitals filed for this visit.  Visit Diagnosis:  S/P left knee arthroscopy  Knee stiffness, left  Stiffness of knee joint, right  Difficulty  walking      Subjective Assessment - 01/24/16 0853    Subjective I made the mistake of trying to mow the lawn, I had to stop halfway. Pain 6/10 this am with being bent in the car.    Currently in Pain? Yes   Pain Score 2    Pain Location Knee   Pain Orientation Left   Pain Type Chronic pain   Pain Onset More than a month ago   Pain Frequency Intermittent   Aggravating Factors  walking on uneven ground, prolonged flexion/sitting, steps, standing/walking for >10 min    Pain Relieving Factors meds, decreased WB, ice, elevation, IFC   Effect of Pain on Daily Activities less able to do housework, yardwork, walking dog.    Multiple Pain Sites No            [facility] PT Assessment - 01/24/16 0903    AROM   Left Knee Flexion 112   Strength   Right Hip Flexion 4+/5   Right Hip Extension 4+/5   Right Hip ABduction 5/5   Left Hip Flexion 4/5   Left Hip Extension 4/5   Left Hip ABduction 4+/5  glute med 3+/5   Right Knee Flexion 5/5   Right Knee Extension 5/5  Left Knee Flexion 4/5   Left Knee Extension 4+/5            [facility] Adult PT Treatment/Exercise - 01/24/16 0905    Knee/Hip Exercises: Stretches   Quad Stretch Both;2 reps;30 seconds   Quad Stretch Limitations strap   Gastroc Stretch Both;2 reps;30 seconds   Soleus Stretch Both;2 reps;30 seconds   Knee/Hip Exercises: Aerobic   Elliptical level 1 ramp and resistance     Cryotherapy   Number Minutes Cryotherapy 10 Minutes   Cryotherapy Location Knee   Type of Cryotherapy Ice pack   Manual Therapy   Soft tissue mobilization L post lat hamstring and gastroc with intermittent IASTM deep pressure                  PT Short Term Goals - 01/24/16 [id number]    PT SHORT TERM GOAL #1   Title Pt will be I with HEP for bilateral knees (initial )   Status Achieved   PT SHORT TERM GOAL #2   Title Pt will be able to walk in the park (30 min ) with noteably less pain in L Lateral knee (25% improved)   Baseline 15 min     Status On-going   PT SHORT TERM GOAL #3   Title Pt will be able to report strategies for RICE, edema mgmt   Status Achieved           PT Long Term Goals - 01/24/16 [id number]    PT LONG TERM GOAL #1   Title Pt will be I with more advanced HEP for bilateral knees    Status On-going   PT LONG TERM GOAL #2   Title Pt will be able to push/pull up to 50# with no increase in knee pain in prep for return to work.    Status On-going   PT LONG TERM GOAL #3   Title Pt will demo 5/5 strength in bilateral LEs for improved function at work.    Status On-going   PT LONG TERM GOAL #4   Title Pt will report no UE assistance needed for standing from low surface   Status On-going   PT LONG TERM GOAL #5   Title Pt will score <45% limited on FOTO to demo improvement and readiness for work.    Status Unable to assess               Plan - 01/24/16 0926    Clinical Impression Statement Patient with pain moderate with normal home tasks, 5/10-6/10.  See goals for progress, working towards reducing pain and tightness post L knee which he feels he may be the return of his Baker's cyst.  Pt will benefit from 4 more weeks of PT .    Pt will benefit from skilled therapeutic intervention in order to improve on the following deficits Abnormal gait;Decreased range of motion;Difficulty walking;Increased fascial restricitons;Impaired UE functional use;Pain;Impaired flexibility;Hypomobility;Decreased balance;Decreased strength;Increased edema;Decreased mobility;Postural dysfunction   Rehab Potential Excellent   PT Frequency 2x / week   PT Duration 4 weeks   PT Treatment/Interventions ADLs/Self Care Home Management;Ultrasound;Neuromuscular re-education;Scar mobilization;Patient/family education;Passive range of motion;Gait training;Stair training;Cryotherapy;Electrical Stimulation;Iontophoresis 4mg /ml Dexamethasone;Moist Heat;Balance training;Therapeutic exercise;Manual techniques;Therapeutic activities;Vasopneumatic  Device;Taping;Functional mobility training   PT Next Visit Plan Stretching, Hip strengthening,  closed chain. MD note on 01/24/16.   PT Home Exercise Plan prone quad stretch and hip abd sidelying, clam , hamstring, ITB    Consulted and Agree with Plan of Care Patient  Problem List Patient Active Problem List   Diagnosis Date Noted  . 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 Diabetes ([place]) 03/01/2013    Drako Maese 01/24/2016, 9:30 AM  [facility] [address] [place], [place], [address] Phone: [phone]   Fax:  [phone]  Name: Jeff Edwards. MRN: [id number] Date of Birth: 05-21-1956    Raeford Razor, PT 01/24/2016 9:30 AM Phone: [phone] Fax: [phone]

## 2016-01-25 ENCOUNTER — Encounter: Payer: 59 | Admitting: Physical Therapy

## 2016-01-30 ENCOUNTER — Ambulatory Visit: Payer: 59 | Admitting: Physical Therapy

## 2016-01-30 DIAGNOSIS — M25662 Stiffness of left knee, not elsewhere classified: Secondary | ICD-10-CM

## 2016-01-30 DIAGNOSIS — R262 Difficulty in walking, not elsewhere classified: Secondary | ICD-10-CM | POA: Diagnosis not present

## 2016-01-30 DIAGNOSIS — M25661 Stiffness of right knee, not elsewhere classified: Secondary | ICD-10-CM

## 2016-01-30 DIAGNOSIS — Z9889 Other specified postprocedural states: Secondary | ICD-10-CM | POA: Diagnosis not present

## 2016-01-30 NOTE — Therapy (Signed)
[place], [place], [address] Phone: [phone]   Fax:  [phone]  Physical Therapy Treatment  Patient Details  Name: Jeff Edwards. MRN: [id number] Date of Birth: [birth year] Referring Provider: Dr. Meridee Score  Encounter Date: 01/30/2016      PT End of Session - 01/30/16 1248    Visit Number 10   Number of Visits 16   Date for PT Re-Evaluation 02/28/16   PT Start Time 1100   PT Stop Time 1155   PT Time Calculation (min) 55 min   Activity Tolerance Patient tolerated treatment well   Behavior During Therapy [facility] for tasks assessed/performed      Past Medical History  Diagnosis Date  . Hypertension   . ADHD (attention deficit hyperactivity disorder)   . GERD (gastroesophageal reflux disease)   . Cancer ([place])     basal cell carcinoma   . Memory change   . 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 Sleep apnea     cpap does not use (study done 10+ yrs) - has lost weight  . Diabetes mellitus     Type 2  . Neuropathy ([place])     in feet  . Arthritis     osteoarthritis in knees  . Anemia     as a child  . Diverticulitis   . Retinal detachment     right eye    Past Surgical History  Procedure Laterality Date  . Basal cell carcinoma excision    . Ankle fracture surgery      broken and reset   age 83s  . Knee arthroscopy  07/10/2012    Procedure: ARTHROSCOPY KNEE;  Surgeon: Newt Minion, MD;  Location: [place] [facility];  Service: Orthopedics;  Laterality: Right;  Right knee arthroscopy and debridement, chrondralplasty  . Colonoscopy    . Knee arthroscopy Left 10/27/2015    Procedure: Left Knee Arthroscopy and Debridement;  Surgeon: Newt Minion, MD;  Location: [facility];  Service: Orthopedics;  Laterality: Left;    There were no vitals filed for this visit.  Visit Diagnosis:  S/P left knee arthroscopy  Knee stiffness, left  Stiffness of knee joint, right  Difficulty  walking      Subjective Assessment - 01/30/16 1107    Subjective Saw the MD.  [employer] back to work 4/6.  I've laid off the walking and my pain is better.    Currently in Pain? Yes   Pain Score 2    Pain Location Knee   Pain Orientation Left   Pain Descriptors / Indicators Aching   Pain Type Chronic pain   Pain Onset More than a month ago   Pain Frequency Intermittent                         [facility] Adult PT Treatment/Exercise - 01/30/16 1113    Knee/Hip Exercises: Stretches   Active Hamstring Stretch Both;2 reps;30 seconds   Quad Stretch Both;2 reps;30 seconds   Quad Stretch Limitations strap   Knee/Hip Exercises: Aerobic   Nustep LEs only, L5-L7 for 8 min    Knee/Hip Exercises: Machines for Strengthening   Cybex Leg Press 2 plates x 15 used ball to squeeze   Knee/Hip Exercises: Standing   Hip Abduction Stengthening;Both;1 set;15 reps   Abduction Limitations foam board   Hip Extension Stengthening;Both;1 set;15 reps   Extension Limitations foam board    Functional Squat 15 reps  Functional Squat Limitations foam board    Other Standing Knee Exercises single leg hip hinging at countertop x 10 each eecentric hamstring    Cryotherapy   Number Minutes Cryotherapy 10 Minutes   Cryotherapy Location Knee   Type of Cryotherapy Ice pack                PT Education - 01/30/16 1143    Education provided Yes   Education Details hip hinging, stabilization   Person(s) Educated Patient   Methods Explanation;Tactile cues   Comprehension Verbalized understanding          PT Short Term Goals - 01/24/16 [id number]    PT SHORT TERM GOAL #1   Title Pt will be I with HEP for bilateral knees (initial )   Status Achieved   PT SHORT TERM GOAL #2   Title Pt will be able to walk in the park (30 min ) with noteably less pain in L Lateral knee (25% improved)   Baseline 15 min    Status On-going   PT SHORT TERM GOAL #3   Title Pt will be able to report strategies for  RICE, edema mgmt   Status Achieved           PT Long Term Goals - 01/24/16 [id number]    PT LONG TERM GOAL #1   Title Pt will be I with more advanced HEP for bilateral knees    Status On-going   PT LONG TERM GOAL #2   Title Pt will be able to push/pull up to 50# with no increase in knee pain in prep for return to work.    Status On-going   PT LONG TERM GOAL #3   Title Pt will demo 5/5 strength in bilateral LEs for improved function at work.    Status On-going   PT LONG TERM GOAL #4   Title Pt will report no UE assistance needed for standing from low surface   Status On-going   PT LONG TERM GOAL #5   Title Pt will score <45% limited on FOTO to demo improvement and readiness for work.    Status Unable to assess               Plan - 01/30/16 1144    Clinical Impression Statement Pt was challenged today with closed chain exercises, motivated by target date for work.  Working towards goals.    PT Next Visit Plan FOTO cont with CCK ex    PT Home Exercise Plan prone quad stretch and hip abd sidelying, clam , hamstring, ITB    Consulted and Agree with Plan of Care Patient        Problem List Patient Active Problem List   Diagnosis Date Noted  . 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 Diabetes ([place]) 03/01/2013    PAA,JENNIFER 01/30/2016, 12:49 PM  [facility] [address] [place], [place], [address] Phone: [phone]   Fax:  [phone]  Name: Jeff Edwards. MRN: [id number] Date of Birth: 06/30/1956    Raeford Razor, PT 01/30/2016 12:49 PM Phone: [phone] Fax: [phone]

## 2016-01-31 MED FILL — PANTOPRAZOLE SOD DR 40 MG T: 40 | 90 days supply | Qty: 90 | Fill #0

## 2016-01-31 MED FILL — LISINOPRIL 5 MG TABLET: 5 | 90 days supply | Qty: 90 | Fill #0

## 2016-02-01 ENCOUNTER — Ambulatory Visit: Payer: 59 | Admitting: Physical Therapy

## 2016-02-01 DIAGNOSIS — Z9889 Other specified postprocedural states: Secondary | ICD-10-CM | POA: Diagnosis not present

## 2016-02-01 DIAGNOSIS — M25661 Stiffness of right knee, not elsewhere classified: Secondary | ICD-10-CM | POA: Diagnosis not present

## 2016-02-01 DIAGNOSIS — R262 Difficulty in walking, not elsewhere classified: Secondary | ICD-10-CM | POA: Diagnosis not present

## 2016-02-01 DIAGNOSIS — M25662 Stiffness of left knee, not elsewhere classified: Secondary | ICD-10-CM | POA: Diagnosis not present

## 2016-02-01 NOTE — Therapy (Signed)
[place], [place], [address] Phone: [phone]   Fax:  [phone]  Physical Therapy Treatment  Patient Details  Name: Jeff Edwards. MRN: [id number] Date of Birth: 16-Sep-1956 Referring Provider: Dr. Meridee Score  Encounter Date: 02/01/2016      PT End of Session - 02/01/16 1433    Visit Number 11   Date for PT Re-Evaluation 02/28/16   PT Start Time 1332   PT Stop Time 1435   PT Time Calculation (min) 63 min   Activity Tolerance Patient tolerated treatment well   Behavior During Therapy [facility] for tasks assessed/performed      Past Medical History  Diagnosis Date  . Hypertension   . ADHD (attention deficit hyperactivity disorder)   . GERD (gastroesophageal reflux disease)   . Cancer ([place])     basal cell carcinoma   . Memory change   . 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 Sleep apnea     cpap does not use (study done 10+ yrs) - has lost weight  . Diabetes mellitus     Type 2  . Neuropathy ([place])     in feet  . Arthritis     osteoarthritis in knees  . Anemia     as a child  . Diverticulitis   . Retinal detachment     right eye    Past Surgical History  Procedure Laterality Date  . Basal cell carcinoma excision    . Ankle fracture surgery      broken and reset   age 43s  . Knee arthroscopy  07/10/2012    Procedure: ARTHROSCOPY KNEE;  Surgeon: Newt Minion, MD;  Location: [place];  Service: Orthopedics;  Laterality: Right;  Right knee arthroscopy and debridement, chrondralplasty  . Colonoscopy    . Knee arthroscopy Left 10/27/2015    Procedure: Left Knee Arthroscopy and Debridement;  Surgeon: Newt Minion, MD;  Location: [facility];  Service: Orthopedics;  Laterality: Left;    There were no vitals filed for this visit.  Visit Diagnosis:  S/P left knee arthroscopy  Knee stiffness, left  Stiffness of knee joint, right  Difficulty walking      Subjective  Assessment - 02/01/16 1337    Subjective Walking  15 -20 minutes prior to pain limits.  Pain unchanged.  MD says it will take time.  Bending is an issue.  Hard to pick things off the floor.     Currently in Pain? Yes   Pain Score 2    Pain Location Knee   Pain Descriptors / Indicators Aching   Pain Radiating Towards lateral    Pain Frequency Intermittent   Aggravating Factors  longer standing working.    Pain Relieving Factors rest, meds,  ICE            [facility] PT Assessment - 02/01/16 0001    Observation/Other Assessments   Focus on Therapeutic Outcomes (FOTO)  71%  not improving                     [facility] Adult PT Treatment/Exercise - 02/01/16 1341    Knee/Hip Exercises: Stretches   Quad Stretch Both;3 reps;30 seconds   Quad Stretch Limitations Strap and standing foot on chair with dips and hold.    Knee/Hip Exercises: Aerobic   Nustep Nustep   Knee/Hip Exercises: Machines for Strengthening   Cybex Knee Extension 25 LBS 2 sets, both , cues position,  Avoid ER  OMEGA   Cybex Knee Flexion 35 LBs 2 sets   Cybex Leg Press 2 plates with yellow ball squeeze.  both then1 leg 10 X 2 sets   Knee/Hip Exercises: Standing   Wall Squat --  12  X stopped due to medial knee pain.    Cryotherapy   Number Minutes Cryotherapy 10 Minutes   Cryotherapy Location Knee   Type of Cryotherapy --  cold pack   Manual Therapy   Manual Therapy Joint mobilization   Joint Mobilization With strap mobilization both                  PT Short Term Goals - 02/01/16 1441    PT SHORT TERM GOAL #1   Title Pt will be I with HEP for bilateral knees (initial )   Status Achieved   PT SHORT TERM GOAL #2   Title Pt will be able to walk in the park (30 min ) with noteably less pain in L Lateral knee (25% improved)   Baseline can walk with pain not improved.   Time 4   Period Weeks   Status On-going   PT SHORT TERM GOAL #3   Title Pt will be able to report strategies for RICE, edema  mgmt   Status Achieved           PT Long Term Goals - 02/01/16 1442    PT LONG TERM GOAL #1   Title Pt will be I with more advanced HEP for bilateral knees    Time 8   Period Weeks   Status On-going   PT LONG TERM GOAL #2   Title Pt will be able to push/pull up to 50# with no increase in knee pain in prep for return to work.    Time 8   Period Weeks   PT LONG TERM GOAL #3   Title Pt will demo 5/5 strength in bilateral LEs for improved function at work.    Time 8   Period Weeks   Status Unable to assess   PT LONG TERM GOAL #4   Title Pt will report no UE assistance needed for standing from low surface   Time 8   Period Weeks   Status Unable to assess   PT LONG TERM GOAL #5   Title Pt will score <45% limited on FOTO to demo improvement and readiness for work.    Baseline FOTO 71% limitation   Time 8   Period Weeks   Status On-going               Plan - 02/01/16 1434    Clinical Impression Statement Patient says he feels stronger.  His pain is unchanged.  Endurance and strength  observed in clinic today has improved since I last saw this patient on 01/10/16.  FOTO score is worse with Limitation 71%.  Initial Limitation was 65 %.     PT Next Visit Plan Check specific goals.  Try pushing/pulling sled to simulate work tasks.   PT Home Exercise Plan continue   Consulted and Agree with Plan of Care Patient        Problem List Patient Active Problem List   Diagnosis Date Noted  . 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 Diabetes ([place]) 03/01/2013    Willian Donson 02/01/2016, 2:46 PM  [facility] [address] [place], [place], [address] Phone: [phone]   Fax:  [phone]  Name: Delfino Lovett  Bonham Jr. MRN: [id number] Date of Birth: [birth year]    [name], PTA 02/01/2016 2:46 PM Phone: [phone] Fax: [phone]

## 2016-02-06 ENCOUNTER — Ambulatory Visit: Payer: 59 | Admitting: Physical Therapy

## 2016-02-06 DIAGNOSIS — M25662 Stiffness of left knee, not elsewhere classified: Secondary | ICD-10-CM | POA: Diagnosis not present

## 2016-02-06 DIAGNOSIS — R262 Difficulty in walking, not elsewhere classified: Secondary | ICD-10-CM

## 2016-02-06 DIAGNOSIS — M25661 Stiffness of right knee, not elsewhere classified: Secondary | ICD-10-CM | POA: Diagnosis not present

## 2016-02-06 DIAGNOSIS — Z9889 Other specified postprocedural states: Secondary | ICD-10-CM

## 2016-02-06 NOTE — Therapy (Signed)
[place], [place], [address] Phone: [phone]   Fax:  [phone]  Physical Therapy Treatment  Patient Details  Name: Jeff Edwards. MRN: [id number] Date of Birth: [birth year] Referring Provider: Dr. Meridee Score  Encounter Date: 02/06/2016      PT End of Session - 02/06/16 0851    Visit Number 12   Number of Visits 16   Date for PT Re-Evaluation 02/28/16   PT Start Time 0847   PT Stop Time 0935   PT Time Calculation (min) 48 min   Activity Tolerance Patient tolerated treatment well   Behavior During Therapy [facility] for tasks assessed/performed      Past Medical History  Diagnosis Date  . Hypertension   . ADHD (attention deficit hyperactivity disorder)   . GERD (gastroesophageal reflux disease)   . Cancer ([place])     basal cell carcinoma   . Memory change   . 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 Sleep apnea     cpap does not use (study done 10+ yrs) - has lost weight  . Diabetes mellitus     Type 2  . Neuropathy ([place])     in feet  . Arthritis     osteoarthritis in knees  . Anemia     as a child  . Diverticulitis   . Retinal detachment     right eye    Past Surgical History  Procedure Laterality Date  . Basal cell carcinoma excision    . Ankle fracture surgery      broken and reset   age 76s  . Knee arthroscopy  07/10/2012    Procedure: ARTHROSCOPY KNEE;  Surgeon: Newt Minion, MD;  Location: [place];  Service: Orthopedics;  Laterality: Right;  Right knee arthroscopy and debridement, chrondralplasty  . Colonoscopy    . Knee arthroscopy Left 10/27/2015    Procedure: Left Knee Arthroscopy and Debridement;  Surgeon: Newt Minion, MD;  Location: [place];  Service: Orthopedics;  Laterality: Left;    There were no vitals filed for this visit.  Visit Diagnosis:  S/P left knee arthroscopy  Knee stiffness, left  Stiffness of knee joint, right  Difficulty  walking      Subjective Assessment - 02/06/16 0850    Subjective Hurting today, driving only 10 min makes my knee hurt.  Walked at the park yesterday and I may have done too much (20 min)    Currently in Pain? Yes   Pain Score 4    Pain Location Knee   Pain Orientation Left   Pain Descriptors / Indicators Aching   Pain Type Chronic pain   Pain Onset More than a month ago   Pain Frequency Intermittent   Aggravating Factors  standing, walking   Pain Relieving Factors rest, meds, ice             [facility] Adult PT Treatment/Exercise - 02/06/16 0903    Therapeutic Activites    Therapeutic Activities Work Simulation: pushed and pulled, maneuvered sled with 60lbs on tile floor 6 x 50 feet, cues for maintaining neutral spine and core/abd braced, esp with turning.    Knee/Hip Exercises: Aerobic   Recumbent Bike 6 min level 2  For ROM and strength    Knee/Hip Exercises: Machines for Strengthening   Other Machine Freemotion for weighted walking x 5 (15 feet) x 3 plates FW x 5 and lateral x 3 each , pain lateral L knee  Manual Therapy   Joint Mobilization patellar mobs   Soft tissue mobilization IASTM to L quads, ITB and lateral hamstrings      will ice at home, biofreeze for L knee pain, explained caution with ice application after pain gel.            PT Education - 02/06/16 0957    Education provided Yes   Education Details progress, FOTO, plan, work Programme researcher, broadcasting/film/video) Educated Patient   Methods Explanation   Comprehension Verbalized understanding          PT Short Term Goals - 02/06/16 0956    PT SHORT TERM GOAL #1   Title Pt will be I with HEP for bilateral knees (initial )   Status Achieved   PT SHORT TERM GOAL #2   Title Pt will be able to walk in the park (30 min ) with noteably less pain in L Lateral knee (25% improved)   Status On-going   PT SHORT TERM GOAL #3   Title Pt will be able to report strategies for RICE, edema mgmt   Status Achieved            PT Long Term Goals - 02/06/16 0956    PT LONG TERM GOAL #1   Title Pt will be I with more advanced HEP for bilateral knees    Status On-going   PT LONG TERM GOAL #2   Title Pt will be able to push/pull up to 50# with no increase in knee pain in prep for return to work.    Status Achieved   PT LONG TERM GOAL #3   Title Pt will demo 5/5 strength in bilateral LEs for improved function at work.    Status On-going   PT LONG TERM GOAL #4   Title Pt will report no UE assistance needed for standing from low surface   Status On-going   PT LONG TERM GOAL #5   Title Pt will score <45% limited on FOTO to demo improvement and readiness for work.    Status On-going               Plan - 02/06/16 0958    Clinical Impression Statement Explained FOTO results to patient.  He feels that at the time of eval he may have not been aware of how limited he was because he was dong less.  I explained the need to show improvement to cont therapy.  He stated he feels stronger, but has persistant lateral knee pain and swelling.  Began some work simulation exercises today.    PT Next Visit Plan cont with pushing/pulling, work sim, address lateral knee pain/swelling and check MMT    PT Home Exercise Plan cont, is I    Consulted and Agree with Plan of Care Patient        Problem List Patient Active Problem List   Diagnosis Date Noted  . 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 Diabetes ([place]) 03/01/2013    Tishie Altmann 02/06/2016, 10:05 AM  [facility] [address] [place], [place], [address] Phone: [phone]   Fax:  [phone]  Name: [name] MRN: [id number] Date of Birth: [birth year]   Raeford Razor, PT 02/06/2016 10:07 AM Phone: [phone] Fax: [phone]

## 2016-02-08 ENCOUNTER — Ambulatory Visit: Payer: 59 | Admitting: Physical Therapy

## 2016-02-08 DIAGNOSIS — M25661 Stiffness of right knee, not elsewhere classified: Secondary | ICD-10-CM

## 2016-02-08 DIAGNOSIS — R262 Difficulty in walking, not elsewhere classified: Secondary | ICD-10-CM | POA: Diagnosis not present

## 2016-02-08 DIAGNOSIS — Z9889 Other specified postprocedural states: Secondary | ICD-10-CM

## 2016-02-08 DIAGNOSIS — M25662 Stiffness of left knee, not elsewhere classified: Secondary | ICD-10-CM

## 2016-02-08 NOTE — Therapy (Signed)
[place], [place], [address] Phone: [phone]   Fax:  [phone]  Physical Therapy Treatment  Patient Details  Name: Jeff Edwards. MRN: [id number] Date of Birth: 10-Mar-1956 Referring Provider: Dr. Meridee Score  Encounter Date: 02/08/2016      PT End of Session - 02/08/16 1312    Visit Number 13   Number of Visits 16   Date for PT Re-Evaluation 02/28/16   PT Start Time 1019   PT Stop Time 1105   PT Time Calculation (min) 46 min   Activity Tolerance Patient tolerated treatment well   Behavior During Therapy [facility] for tasks assessed/performed      Past Medical History  Diagnosis Date  . Hypertension   . ADHD (attention deficit hyperactivity disorder)   . GERD (gastroesophageal reflux disease)   . Cancer ([place])     basal cell carcinoma   . Memory change   . 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 Sleep apnea     cpap does not use (study done 10+ yrs) - has lost weight  . Diabetes mellitus     Type 2  . Neuropathy ([place])     in feet  . Arthritis     osteoarthritis in knees  . Anemia     as a child  . Diverticulitis   . Retinal detachment     right eye    Past Surgical History  Procedure Laterality Date  . Basal cell carcinoma excision    . Ankle fracture surgery      broken and reset   age 25s  . Knee arthroscopy  07/10/2012    Procedure: ARTHROSCOPY KNEE;  Surgeon: Newt Minion, MD;  Location: [place];  Service: Orthopedics;  Laterality: Right;  Right knee arthroscopy and debridement, chrondralplasty  . Colonoscopy    . Knee arthroscopy Left 10/27/2015    Procedure: Left Knee Arthroscopy and Debridement;  Surgeon: Newt Minion, MD;  Location: [place];  Service: Orthopedics;  Laterality: Left;    There were no vitals filed for this visit.  Visit Diagnosis:  S/P left knee arthroscopy  Knee stiffness, left  Stiffness of knee joint, right  Difficulty  walking      Subjective Assessment - 02/08/16 1021    Subjective No pain at rest.  6/10 with driving.  Pain seems to be getting worse not better.   When I leave here most of the time I feel better.  Used cane to walk. Down hill walking is worse.  Walking.,  It is a little easier getting up from low surfaces because I am a little stronger.    Currently in Pain? No/denies   Pain Score --  up to 6/10   Pain Location Knee   Pain Orientation Left;Posterior;Lateral   Pain Descriptors / Indicators Aching   Pain Radiating Towards lateral thigh and to lateral calf   Pain Frequency Intermittent   Aggravating Factors  driving , standing walking            [facility] PT Assessment - 02/08/16 0001    Strength   Right Hip Flexion 4+/5   Right Hip Extension 4+/5   Right Hip ABduction 5/5   Left Hip Flexion 4+/5   Left Hip Extension 4+/5   Left Hip ABduction 5/5   Left Knee Flexion 4/5   Left Knee Extension 4+/5  [facility] Adult PT Treatment/Exercise - 02/08/16 1033    Self-Care   Self-Care --  Baker's cyst discussed.  Handout issued from [facility] reference   Therapeutic Activites    Therapeutic Activities Work Simulation  55 LBS sled push.  43 feet 4 X    Knee/Hip Exercises: Aerobic   Recumbent Bike 6 minutes  1.2 miles L!   Knee/Hip Exercises: Supine   Quad Sets 5 reps   Quad Sets Limitations pain 6/10 posterior knee   Bridges Limitations 10   Straight Leg Raises 10 reps   Straight Leg Raises Limitations pain 3-4/10   eases with rest   Patellar Mobs checked   Knee/Hip Exercises: Sidelying   Clams 10 X with green band                 PT Education - 02/08/16 1310    Education provided Yes   Education Details Baker's cyst info given and discussed.  Patient had multiple questions and things to say about knee pain ,   Clam sidelying. Blue band.  Verbal instruction only.    Person(s) Educated Patient   Methods Explanation;Handout   Comprehension  Verbalized understanding          PT Short Term Goals - 02/08/16 1026    PT SHORT TERM GOAL #1   Title Pt will be I with HEP for bilateral knees (initial )   Period Weeks   Status Achieved   PT SHORT TERM GOAL #2   Title Pt will be able to walk in the park (30 min ) with noteably less pain in L Lateral knee (25% improved)   Baseline pain not improved .  Able to walk 10-15 minutes   Time 4   Period Weeks   Status On-going   PT SHORT TERM GOAL #3   Title Pt will be able to report strategies for RICE, edema mgmt   Baseline has equipment to do at home.  Neopreen sleeve with pockets for cold packs.   Time 4   Period Weeks   Status Achieved           PT Long Term Goals - 02/08/16 1026    PT LONG TERM GOAL #1   Title Pt will be I with more advanced HEP for bilateral knees    Baseline independent with those issued so far   Time 8   Period Weeks   Status On-going   PT LONG TERM GOAL #2   Title Pt will be able to push/pull up to 50# with no increase in knee pain in prep for return to work.    Time 8   Period Weeks   Status Achieved   PT LONG TERM GOAL #3   Title Pt will demo 5/5 strength in bilateral LEs for improved function at work.    Baseline 4 to 5/5   Time 8   Period Weeks   Status On-going   PT LONG TERM GOAL #4   Title Pt will report no UE assistance needed for standing from low surface   Baseline uses his arms a little less when getting up. , still needs   Time 8   Period Weeks   Status On-going   PT LONG TERM GOAL #5   Title Pt will score <45% limited on FOTO to demo improvement and readiness for work.    Baseline FOTO 71% limitation   Time 8   Period Weeks   Status On-going  Plan - 02/08/16 1313    Clinical Impression Statement Strength is improving hips and knees.  Patient has worse pain in general however he notes his knee always feels better after PT.  Patient is focused on pain he gets from his Baker's cyst and how it relates to  his activities. Pain 2-3/10 post session today.   PT Next Visit Plan cont with pushing/pulling, work sim, address lateral knee pain/swelling and check how MD visit went.   PT Home Exercise Plan Clams with band sidelying.   Consulted and Agree with Plan of Care Patient        Problem List Patient Active Problem List   Diagnosis Date Noted  . 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 Diabetes ([place]) 03/01/2013    Katey Barrie 02/08/2016, 1:27 PM  [facility] [address] [place], [place], [address] Phone: [phone]   Fax:  [phone]  Name: Jeff Edwards. MRN: [id number] Date of Birth: 21-Aug-1956    [name], PTA 02/08/2016 1:27 PM Phone: [phone] Fax: [phone]

## 2016-02-08 NOTE — Patient Instructions (Addendum)
Baker's cyst info given. Abduction: Clam (Eccentric) - Side-Lying    Lie on side with knees bent. Lift top knee, keeping feet together. Keep trunk steady. Slowly lower for 3-5 seconds. _10__ reps per set, _1-3__ sets per day, _3-4__ days per week. Use blue band. http://ecce.exer.us/64   Copyright  VHI. All rights reserved.

## 2016-02-12 ENCOUNTER — Ambulatory Visit: Payer: 59 | Admitting: Physical Therapy

## 2016-02-12 DIAGNOSIS — R262 Difficulty in walking, not elsewhere classified: Secondary | ICD-10-CM | POA: Diagnosis not present

## 2016-02-12 DIAGNOSIS — M25661 Stiffness of right knee, not elsewhere classified: Secondary | ICD-10-CM | POA: Diagnosis not present

## 2016-02-12 DIAGNOSIS — Z9889 Other specified postprocedural states: Secondary | ICD-10-CM

## 2016-02-12 DIAGNOSIS — M25662 Stiffness of left knee, not elsewhere classified: Secondary | ICD-10-CM | POA: Diagnosis not present

## 2016-02-12 NOTE — Therapy (Signed)
[place], [place], [address] Phone: [phone]   Fax:  [phone]  Physical Therapy Treatment  Patient Details  Name: Jeff Edwards. MRN: [id number] Date of Birth: [birth year] Referring Provider: Dr. Meridee Score  Encounter Date: 02/12/2016      PT End of Session - 02/12/16 0904    Visit Number 14   Number of Visits 16   Date for PT Re-Evaluation 02/28/16   PT Start Time 0847   PT Stop Time 0940   PT Time Calculation (min) 53 min   Activity Tolerance Patient tolerated treatment well   Behavior During Therapy [facility] for tasks assessed/performed      Past Medical History  Diagnosis Date  . Hypertension   . ADHD (attention deficit hyperactivity disorder)   . GERD (gastroesophageal reflux disease)   . Cancer ([place])     basal cell carcinoma   . Memory change   . 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 Sleep apnea     cpap does not use (study done 10+ yrs) - has lost weight  . Diabetes mellitus     Type 2  . Neuropathy ([place])     in feet  . Arthritis     osteoarthritis in knees  . Anemia     as a child  . Diverticulitis   . Retinal detachment     right eye    Past Surgical History  Procedure Laterality Date  . Basal cell carcinoma excision    . Ankle fracture surgery      broken and reset   age 32s  . Knee arthroscopy  07/10/2012    Procedure: ARTHROSCOPY KNEE;  Surgeon: Newt Minion, MD;  Location: [place];  Service: Orthopedics;  Laterality: Right;  Right knee arthroscopy and debridement, chrondralplasty  . Colonoscopy    . Knee arthroscopy Left 10/27/2015    Procedure: Left Knee Arthroscopy and Debridement;  Surgeon: Newt Minion, MD;  Location: [place];  Service: Orthopedics;  Laterality: Left;    There were no vitals filed for this visit.  Visit Diagnosis:  S/P left knee arthroscopy  Knee stiffness, left  Stiffness of knee joint, right  Difficulty  walking      Subjective Assessment - 02/12/16 0849    Subjective The doctor offered him Ibuprofen. Mentioned Synvisc but insurance issues.  Cannot do cortisone.  Riding in the car is excruciating.    Currently in Pain? Yes   Pain Score 1    Pain Location Knee   Pain Orientation Left;Anterior;Posterior   Pain Descriptors / Indicators Aching   Pain Type Chronic pain              [facility] Adult PT Treatment/Exercise - 02/12/16 0904    Knee/Hip Exercises: Stretches   Active Hamstring Stretch Left;3 reps;30 seconds   ITB Stretch Left;3 reps;30 seconds   Knee/Hip Exercises: Aerobic   Nustep L5 LEs only 7 min    Knee/Hip Exercises: Standing   Wall Squat 1 set;10 reps   SLS with Vectors hip add, flexion and abd 2 x 20 green band   needed bilat. UE support    Knee/Hip Exercises: Supine   Bridges Limitations 2 x 10 with ball    Straight Leg Raises Both;1 set;10 reps   Straight Leg Raises Limitations from ball    Cryotherapy   Number Minutes Cryotherapy 8 Minutes   Cryotherapy Location Knee   Type of Cryotherapy Ice pack  Manual Therapy   Joint Mobilization patellar mobs   McConnell for lateral stress (I ) strip                  PT Short Term Goals - 02/12/16 0942    PT SHORT TERM GOAL #1   Title Pt will be I with HEP for bilateral knees (initial )   Status Achieved   PT SHORT TERM GOAL #2   Title Pt will be able to walk in the park (30 min ) with noteably less pain in L Lateral knee (25% improved)   Status On-going   PT SHORT TERM GOAL #3   Title Pt will be able to report strategies for RICE, edema mgmt   Status Achieved           PT Long Term Goals - 02/12/16 7829    PT LONG TERM GOAL #1   Title Pt will be I with more advanced HEP for bilateral knees    Status On-going   PT LONG TERM GOAL #2   Title Pt will be able to push/pull up to 50# with no increase in knee pain in prep for return to work.    Status Achieved   PT LONG TERM GOAL #3   Title Pt  will demo 5/5 strength in bilateral LEs for improved function at work.    Status On-going   PT LONG TERM GOAL #4   Title Pt will report no UE assistance needed for standing from low surface   Status On-going   PT LONG TERM GOAL #5   Title Pt will score <45% limited on FOTO to demo improvement and readiness for work.    Status On-going               Plan - 02/12/16 0940    Clinical Impression Statement Patient with continual low level of pain during functional exercises.  Tried Mcconnell tape to decrease lateral knee symptoms.  Pt aware of his options regarding PT.  No goals met.     PT Next Visit Plan cont with pushing/pulling, work sim, address lateral knee pain/swelling see if tape helped   PT [place] with band sidelying.   Consulted and Agree with Plan of Care Patient        Problem List Patient Active Problem List   Diagnosis Date Noted  . 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 Diabetes ([place]) 03/01/2013    Jeff Edwards 02/12/2016, 9:44 AM  [facility] [address] [place], [place], [address] Phone: [phone]   Fax:  [phone]  Name: Jeff Edwards. MRN: [id number] Date of Birth: Sep 05, 1956    Raeford Razor, PT 02/12/2016 9:44 AM Phone: [phone] Fax: [phone]

## 2016-02-13 MED FILL — METHYLPHENIDATE 10 MG TAB: 10 | 30 days supply | Qty: 60 | Fill #0

## 2016-02-14 ENCOUNTER — Ambulatory Visit: Payer: 59

## 2016-02-14 DIAGNOSIS — R262 Difficulty in walking, not elsewhere classified: Secondary | ICD-10-CM

## 2016-02-14 DIAGNOSIS — M25661 Stiffness of right knee, not elsewhere classified: Secondary | ICD-10-CM | POA: Diagnosis not present

## 2016-02-14 DIAGNOSIS — Z9889 Other specified postprocedural states: Secondary | ICD-10-CM

## 2016-02-14 DIAGNOSIS — M25662 Stiffness of left knee, not elsewhere classified: Secondary | ICD-10-CM | POA: Diagnosis not present

## 2016-02-14 NOTE — Therapy (Addendum)
[place], [place], [address] Phone: [phone]   Fax:  [phone]  Physical Therapy Treatment  Patient Details  Name: Jeff Edwards. MRN: [id number] Date of Birth: [birth year] Referring Provider: Dr. Meridee Score  Encounter Date: 02/14/2016      PT End of Session - 02/14/16 1249    Visit Number 15   Number of Visits 16   Date for PT Re-Evaluation 02/28/16   PT Start Time 0850   PT Stop Time 0930   PT Time Calculation (min) 40 min   Activity Tolerance Patient tolerated treatment well   Behavior During Therapy [facility] for tasks assessed/performed      Past Medical History  Diagnosis Date  . Hypertension   . ADHD (attention deficit hyperactivity disorder)   . GERD (gastroesophageal reflux disease)   . Cancer ([place])     basal cell carcinoma   . Memory change   . 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 Sleep apnea     cpap does not use (study done 10+ yrs) - has lost weight  . Diabetes mellitus     Type 2  . Neuropathy ([place])     in feet  . Arthritis     osteoarthritis in knees  . Anemia     as a child  . Diverticulitis   . Retinal detachment     right eye    Past Surgical History  Procedure Laterality Date  . Basal cell carcinoma excision    . Ankle fracture surgery      broken and reset   age 2s  . Knee arthroscopy  07/10/2012    Procedure: ARTHROSCOPY KNEE;  Surgeon: Newt Minion, MD;  Location: [place];  Service: Orthopedics;  Laterality: Right;  Right knee arthroscopy and debridement, chrondralplasty  . Colonoscopy    . Knee arthroscopy Left 10/27/2015    Procedure: Left Knee Arthroscopy and Debridement;  Surgeon: Newt Minion, MD;  Location: [place];  Service: Orthopedics;  Laterality: Left;    There were no vitals filed for this visit.  Visit Diagnosis:  S/P left knee arthroscopy  Knee stiffness, left  Stiffness of knee joint, right  Difficulty  walking      Subjective Assessment - 02/14/16 0907    Subjective Pt admits to still no change and agrees tpo plateau in progress. Pt is agreeable to DC next visit (4/10) after seeing MD on 02/22/16.    Currently in Pain? Yes   Pain Score 1    Pain Location Knee   Pain Orientation Left;Anterior   Pain Descriptors / Indicators Aching   Pain Type Chronic pain            [facility] PT Assessment - 02/14/16 0001    Observation/Other Assessments   Focus on Therapeutic Outcomes (FOTO)  55% limited    AROM   Left Knee Extension -4  lacking 4 degrees from neutral.    Left Knee Flexion 110   Strength   Left Hip Flexion 3+/5  supine SLR (interrater reliability)    Left Hip Extension 4/5   Left Knee Flexion 4/5   Left Knee Extension 4+/5                     [facility] Adult PT Treatment/Exercise - 02/14/16 0001    Knee/Hip Exercises: Stretches   Active Hamstring Stretch 3 reps;30 seconds   Active Hamstring Stretch Limitations --  seated  Knee: Self-Stretch to increase Flexion 5 reps;10 seconds   Other Knee/Hip Stretches sit<> stand  5 c VCs for technique   promote knee flex and eccentric control    Knee/Hip Exercises: Aerobic   Recumbent Bike 6 mins   Knee/Hip Exercises: Standing   Gait Training 15 mins VCs for increased stance time on L LE, decreased listing   rec to use SPC.    Knee/Hip Exercises: Supine   Quad Sets 10 reps                PT Education - 02/14/16 [phone]    Education provided Yes   Education Details Upcoming PT DC planned, plateau in progress, seated HS stretch.    Person(s) Educated Patient   Methods Explanation;Handout   Comprehension Verbalized understanding          PT Short Term Goals - 02/12/16 0942    PT SHORT TERM GOAL #1   Title Pt will be I with HEP for bilateral knees (initial )   Status Achieved   PT SHORT TERM GOAL #2   Title Pt will be able to walk in the park (30 min ) with noteably less pain in L Lateral knee (25%  improved)   Status On-going   PT SHORT TERM GOAL #3   Title Pt will be able to report strategies for RICE, edema mgmt   Status Achieved           PT Long Term Goals - 02/12/16 2836    PT LONG TERM GOAL #1   Title Pt will be I with more advanced HEP for bilateral knees    Status On-going   PT LONG TERM GOAL #2   Title Pt will be able to push/pull up to 50# with no increase in knee pain in prep for return to work.    Status Achieved   PT LONG TERM GOAL #3   Title Pt will demo 5/5 strength in bilateral LEs for improved function at work.    Status On-going   PT LONG TERM GOAL #4   Title Pt will report no UE assistance needed for standing from low surface   Status On-going   PT LONG TERM GOAL #5   Title Pt will score <45% limited on FOTO to demo improvement and readiness for work.    Status On-going               Plan - 02/14/16 0915    Clinical Impression Statement Pt has plateaued in progress and continues to have pain with all activities. Concentrated on gait training today to prevent antalgic gait and increase stance time on L LE. PT plans to return to MD 02/22/16, then will return for DC on 02/26/16. Pt understands and is agreeable to this.    PT Next Visit Plan DC due to plateau in progress. Pt saw MD on 02/22/16. What did MD say?    PT Home Exercise Plan Clams with band sidelying. Seated HS stretch.    Consulted and Agree with Plan of Care Patient        Problem List Patient Active Problem List   Diagnosis Date Noted  . 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 Diabetes ([place]) 03/01/2013    Dollene Cleveland, PT 02/14/2016, 12:55 PM  [facility] [address] [place], [place], [address] Phone: [phone]   Fax:  [phone]  Name: Jeff Edwards. MRN: [id number] Date of Birth: May 06, 1956  PHYSICAL THERAPY DISCHARGE SUMMARY  Visits from Start of Care: 15  Current functional level  related to goals / functional outcomes: See above   Remaining deficits: See above   Education / Equipment: HEP, RICE Plan: Patient agrees to discharge.  Patient goals were partially met. Patient is being discharged due to lack of progress.  ?????    Pt was referred back to MD and did not return.  Romualdo Bolk, PT, DPT 06/27/16 10:12 AM Phone: [phone] Fax: [phone]

## 2016-02-19 ENCOUNTER — Encounter: Payer: 59 | Admitting: Physical Therapy

## 2016-02-21 ENCOUNTER — Encounter: Payer: 59 | Admitting: Physical Therapy

## 2016-02-22 DIAGNOSIS — M1712 Unilateral primary osteoarthritis, left knee: Secondary | ICD-10-CM | POA: Diagnosis not present

## 2016-02-22 DIAGNOSIS — E1142 Type 2 diabetes mellitus with diabetic polyneuropathy: Secondary | ICD-10-CM | POA: Diagnosis not present

## 2016-02-22 DIAGNOSIS — M25562 Pain in left knee: Secondary | ICD-10-CM | POA: Diagnosis not present

## 2016-02-22 MED FILL — OXYCODONE/APAP 5-325: 5-325 | 20 days supply | Qty: 40 | Fill #0

## 2016-02-22 MED FILL — DICLOFENAC SODIUM 1% GEL: 1 | 25 days supply | Qty: 200 | Fill #0

## 2016-02-26 ENCOUNTER — Encounter: Payer: 59 | Admitting: Physical Therapy

## 2016-02-27 DIAGNOSIS — M1711 Unilateral primary osteoarthritis, right knee: Secondary | ICD-10-CM | POA: Diagnosis not present

## 2016-02-28 ENCOUNTER — Encounter: Payer: 59 | Admitting: Physical Therapy

## 2016-03-06 ENCOUNTER — Ambulatory Visit: Payer: 59 | Attending: Orthopedic Surgery | Admitting: Physical Therapy

## 2016-03-07 MED FILL — ASPIR-LOW EC 81 MG TABLET: 81 | 90 days supply | Qty: 90 | Fill #0

## 2016-03-15 ENCOUNTER — Ambulatory Visit: Payer: Self-pay | Admitting: Pharmacist

## 2016-03-15 MED FILL — METHYLPHENIDATE 10 MG TAB: 10 | 30 days supply | Qty: 60 | Fill #0

## 2016-03-25 MED FILL — ATORVASTATIN 20 MG TABLET: 20 | 90 days supply | Qty: 90 | Fill #0

## 2016-03-26 DIAGNOSIS — M1712 Unilateral primary osteoarthritis, left knee: Secondary | ICD-10-CM | POA: Diagnosis not present

## 2016-03-26 DIAGNOSIS — M25562 Pain in left knee: Secondary | ICD-10-CM | POA: Diagnosis not present

## 2016-03-26 MED FILL — OXYCODONE/APAP 5-325: 5-325 | 20 days supply | Qty: 40 | Fill #0

## 2016-03-26 MED FILL — metFORMIN HCL 500 MG TABS: 500 | 90 days supply | Qty: 360 | Fill #1

## 2016-04-05 ENCOUNTER — Encounter: Payer: Self-pay | Admitting: Pharmacist

## 2016-04-05 ENCOUNTER — Other Ambulatory Visit: Payer: Self-pay | Admitting: Pharmacist

## 2016-04-05 VITALS — BP 122/76 | Wt 240.6 lb

## 2016-04-05 DIAGNOSIS — E119 Type 2 diabetes mellitus without complications: Secondary | ICD-10-CM

## 2016-04-05 NOTE — Patient Outreach (Signed)
Subjective:  Patient presents today for 3 month diabetes follow-up as part of the employer-sponsored [employer] to [employer] program.  Current diabetes regimen includes metformin 1000 mg BID & glimepiride 1 mg daily. Patient also continues on daily ASA, ACE Inhibitor and statin.  Most recent MD follow-up was in December with Dr. Marisue Edwards. He had an A1C drawn a few months ago but patient was not sure of the result.  Patient has a pending appt for June to see Dr. Marisue Edwards. No major health changes at this time.   Patient is not working currently. He is still having pain in his left knee. He had surgery on it in December and has not been able to go back to work after the surgery. He has completed physical therapy and it has not improved his pain but did improve flexibility. He reports that all of this has caused him to be more depressed. Completed depression screening for patient (score of 10).   Medication change- he was started on glimepiride 1 mg daily about 2 months ago. He report no low blood sugars since starting it. He also is taking oxycodone/APAP instead of hydrocodone for pain. He reports that he is trying not to take it as often and hasn't had any in the past 3 days.   Assessment:  Diabetes: Most recent A1C was  7.7 % which is exceeding goal of less than 7%. Based on most recent CBGs I expect A1C is still above goal. Weight is increased from last visit with me.    CBG Review: He reports that for a few weeks he wasn't checking his blood sugar at all. A couple of weeks ago he started checking again once daily. Most recent FBG are elevated- usually running 150-200 mg/dL. He admits that he has been eating ice cream daily at night and this is showing up as elevated FBG.   High.low- 197/119   Lifestyle improvements:  Physical Activity-  He has been limited in physical activity due to pain. He has been doing some limited walking and yard work. (Watering yard and flowers).   Nutrition-  He does admit  to eating more ice cream lately. He is no longer working and he is back on a day shift schedule. He thinks that he has been eating more often. Portion sizes at meals are about the same (he reports that he does not go back for seconds) but he is eating more concentrated sweets and is snacking more often during the day. He admits to eating ice cream, cookies.   Mental Health-  Spent the majority of the visit talking to patient about the depressive symptoms and concerns that he has been having. It seems that he is missing having contact with his work family and feeling like he is a part of something greater. He mentioned that he had thought about starting to volunteer in order to build relationships with others. I encouraged him to look into local volunteer opportunities and provided him with some websites he can look into.   I strongly encouraged him to contact the employee assistance program to start counseling and to also talk to Dr. Marisue Edwards about the symptoms he has been having.   Follow up with me in 3 months.    Plan/Goals for Next Visit:  1. At your next appointment with Dr. Marisue Edwards talk to him about the depressive symptoms that you have been having. Also consider contacting the Employee Assistance Counseling program- Call [place] directly at [phone] or [phone].  2. Look for  opportunities for volunteer. Https://www.volunteergso.org/; RareVoices.pl; PaidValue.at  3. Ideas for physical activity- think about joining the [employer] to take advantage of the pool and the group exercise classes.   4. Limit concentrated sweets including ice cream, cake, cookies to twice a week. When you are grocery shopping, look for items that are individually packaged or single servings instead of a large box so that you can more easily control how much you eat.    Next appointment to see me is: Friday August 4th at 8 AM.    Jeff Edwards. Jeff Edwards, PharmD, BCPS, CDE Jeff Edwards to [place] Coordinator [phone]

## 2016-04-09 MED FILL — HYDROCODON-APAP 5-325: 5-325 | 60 days supply | Qty: 60 | Fill #0

## 2016-04-16 MED FILL — GLIMEPIRIDE 1 MG TABLET: 1 | 90 days supply | Qty: 90 | Fill #1

## 2016-04-22 DIAGNOSIS — M1990 Unspecified osteoarthritis, unspecified site: Secondary | ICD-10-CM | POA: Diagnosis not present

## 2016-04-22 DIAGNOSIS — M25562 Pain in left knee: Secondary | ICD-10-CM | POA: Diagnosis not present

## 2016-04-22 DIAGNOSIS — E1129 Type 2 diabetes mellitus with other diabetic kidney complication: Secondary | ICD-10-CM | POA: Diagnosis not present

## 2016-04-22 DIAGNOSIS — R809 Proteinuria, unspecified: Secondary | ICD-10-CM | POA: Diagnosis not present

## 2016-04-22 DIAGNOSIS — F909 Attention-deficit hyperactivity disorder, unspecified type: Secondary | ICD-10-CM | POA: Diagnosis not present

## 2016-04-22 DIAGNOSIS — I10 Essential (primary) hypertension: Secondary | ICD-10-CM | POA: Diagnosis not present

## 2016-04-22 DIAGNOSIS — M1712 Unilateral primary osteoarthritis, left knee: Secondary | ICD-10-CM | POA: Diagnosis not present

## 2016-04-22 DIAGNOSIS — E1149 Type 2 diabetes mellitus with other diabetic neurological complication: Secondary | ICD-10-CM | POA: Diagnosis not present

## 2016-04-22 DIAGNOSIS — E782 Mixed hyperlipidemia: Secondary | ICD-10-CM | POA: Diagnosis not present

## 2016-04-22 DIAGNOSIS — E1140 Type 2 diabetes mellitus with diabetic neuropathy, unspecified: Secondary | ICD-10-CM | POA: Diagnosis not present

## 2016-04-22 DIAGNOSIS — J309 Allergic rhinitis, unspecified: Secondary | ICD-10-CM | POA: Diagnosis not present

## 2016-04-22 MED FILL — CELECOXIB 200 MG CAPSULE: 200 | 30 days supply | Qty: 30 | Fill #0

## 2016-04-23 DIAGNOSIS — M1712 Unilateral primary osteoarthritis, left knee: Secondary | ICD-10-CM | POA: Diagnosis not present

## 2016-05-06 MED FILL — PANTOPRAZOLE SOD DR 40 MG T: 40 | 90 days supply | Qty: 90 | Fill #0

## 2016-05-13 MED FILL — LISINOPRIL 5 MG TABLET: 5 | 90 days supply | Qty: 90 | Fill #0

## 2016-05-22 DIAGNOSIS — M1712 Unilateral primary osteoarthritis, left knee: Secondary | ICD-10-CM | POA: Diagnosis not present

## 2016-05-22 DIAGNOSIS — M25552 Pain in left hip: Secondary | ICD-10-CM | POA: Diagnosis not present

## 2016-05-29 MED FILL — CELECOXIB 200 MG CAPSULE: 200 | 30 days supply | Qty: 30 | Fill #0

## 2016-05-29 MED FILL — METHYLPHENIDATE 10 MG TAB: 10 | 30 days supply | Qty: 60 | Fill #0

## 2016-06-05 MED FILL — GLIMEPIRIDE 2 MG TABLET: 2 | 90 days supply | Qty: 90 | Fill #0

## 2016-06-05 MED FILL — ASPIR-LOW EC 81 MG TABLET: 81 | 90 days supply | Qty: 90 | Fill #0

## 2016-06-05 MED FILL — CEPHALEXIN 500 MG CAPSULE: 500 | 7 days supply | Qty: 28 | Fill #0

## 2016-06-20 ENCOUNTER — Other Ambulatory Visit: Payer: Self-pay | Admitting: Orthopaedic Surgery

## 2016-06-21 ENCOUNTER — Ambulatory Visit: Payer: Self-pay | Admitting: Pharmacist

## 2016-06-24 MED FILL — ATORVASTATIN 20 MG TABLET: 20 | 90 days supply | Qty: 90 | Fill #0

## 2016-06-28 MED FILL — CELECOXIB 200 MG CAPSULE: 200 | 30 days supply | Qty: 30 | Fill #0

## 2016-06-28 MED FILL — metFORMIN HCL 500 MG TABS: 500 | 90 days supply | Qty: 360 | Fill #2

## 2016-07-10 NOTE — Pre-Procedure Instructions (Signed)
    Jeff Edwards.  07/10/2016      [place], [place] - [address] [place] [address] Phone: [phone] Fax: [phone]    Your procedure is scheduled on 07/23/16.  Report to [facility] Admitting at 530 A.M.  Call this number if you have problems the morning of surgery:  [phone]   Remember:  Do not eat food or drink liquids after midnight.  Take these medicines the morning of surgery with A SIP OF WATER --hydrocodone,protonix   Do not wear jewelry, make-up or nail polish.  Do not wear lotions, powders, or perfumes, or deoderant.  Do not shave 48 hours prior to surgery.  Men may shave face and neck.  Do not bring valuables to the hospital.  [facility] is not responsible for any belongings or valuables.  Contacts, dentures or bridgework may not be worn into surgery.  Leave your suitcase in the car.  After surgery it may be brought to your room.  For patients admitted to the hospital, discharge time will be determined by your treatment team.  Patients discharged the day of surgery will not be allowed to drive home.   Name and phone number of your driver:   Special instructions:  Do not take any aspirin,anti-inflammatories,vitamins,or herbal supplements 5-7 days prior to surgery.  Please read over the following fact sheets that you were given. MRSA Information

## 2016-07-11 ENCOUNTER — Encounter (HOSPITAL_COMMUNITY)
Admission: RE | Admit: 2016-07-11 | Discharge: 2016-07-11 | Disposition: A | Discharge status: Home / Self Care | Payer: 59 | Source: Ambulatory Visit | Attending: Orthopaedic Surgery | Admitting: Orthopaedic Surgery

## 2016-07-11 ENCOUNTER — Ambulatory Visit (HOSPITAL_COMMUNITY)
Admission: RE | Admit: 2016-07-11 | Discharge: 2016-07-11 | Disposition: A | Discharge status: Home / Self Care | Payer: 59 | Source: Ambulatory Visit | Attending: Orthopaedic Surgery | Admitting: Orthopaedic Surgery

## 2016-07-11 ENCOUNTER — Encounter (HOSPITAL_COMMUNITY): Payer: Self-pay

## 2016-07-11 DIAGNOSIS — Z01818 Encounter for other preprocedural examination: Secondary | ICD-10-CM | POA: Insufficient documentation

## 2016-07-11 DIAGNOSIS — I700 Atherosclerosis of aorta: Secondary | ICD-10-CM | POA: Diagnosis not present

## 2016-07-11 LAB — ABO/RH: ABO/RH(D): A NEG

## 2016-07-11 LAB — BASIC METABOLIC PANEL
ANION GAP: 9 (ref 5–15)
BUN: 8 mg/dL (ref 6–20)
CALCIUM: 9.4 mg/dL (ref 8.9–10.3)
CO2: 25 mmol/L (ref 22–32)
Chloride: 105 mmol/L (ref 101–111)
Creatinine, Ser: 0.78 mg/dL (ref 0.61–1.24)
GLUCOSE: 162 mg/dL — AB (ref 65–99)
POTASSIUM: 4.2 mmol/L (ref 3.5–5.1)
Sodium: 139 mmol/L (ref 135–145)

## 2016-07-11 LAB — URINALYSIS, ROUTINE W REFLEX MICROSCOPIC
Bilirubin Urine: NEGATIVE
Glucose, UA: NEGATIVE mg/dL
Ketones, ur: NEGATIVE mg/dL
LEUKOCYTES UA: NEGATIVE
NITRITE: NEGATIVE
PH: 5 (ref 5.0–8.0)
Protein, ur: 30 mg/dL — AB
SPECIFIC GRAVITY, URINE: 1.022 (ref 1.005–1.030)

## 2016-07-11 LAB — URINE MICROSCOPIC-ADD ON: BACTERIA UA: NONE SEEN

## 2016-07-11 LAB — CBC WITH DIFFERENTIAL/PLATELET
Basophils Absolute: 0.1 10*3/uL (ref 0.0–0.1)
Basophils Relative: 2 %
EOS ABS: 0.3 10*3/uL (ref 0.0–0.7)
EOS PCT: 3 %
HCT: 44.4 % (ref 39.0–52.0)
Hemoglobin: 14.1 g/dL (ref 13.0–17.0)
LYMPHS ABS: 2.4 10*3/uL (ref 0.7–4.0)
Lymphocytes Relative: 30 %
MCH: 26.2 pg (ref 26.0–34.0)
MCHC: 31.8 g/dL (ref 30.0–36.0)
MCV: 82.4 fL (ref 78.0–100.0)
MONOS PCT: 8 %
Monocytes Absolute: 0.6 10*3/uL (ref 0.1–1.0)
NEUTROS PCT: 57 %
Neutro Abs: 4.7 10*3/uL (ref 1.7–7.7)
PLATELETS: 266 10*3/uL (ref 150–400)
RBC: 5.39 MIL/uL (ref 4.22–5.81)
RDW: 13.7 % (ref 11.5–15.5)
WBC: 8.1 10*3/uL (ref 4.0–10.5)

## 2016-07-11 LAB — TYPE AND SCREEN
ABO/RH(D): A NEG
Antibody Screen: NEGATIVE

## 2016-07-11 LAB — SURGICAL PCR SCREEN
MRSA, PCR: NEGATIVE
STAPHYLOCOCCUS AUREUS: NEGATIVE

## 2016-07-11 LAB — GLUCOSE, CAPILLARY: GLUCOSE-CAPILLARY: 178 mg/dL — AB (ref 65–99)

## 2016-07-11 LAB — PROTIME-INR
INR: 0.91
PROTHROMBIN TIME: 12.2 s (ref 11.4–15.2)

## 2016-07-11 LAB — APTT: APTT: 29 s (ref 24–36)

## 2016-07-12 LAB — HEMOGLOBIN A1C
Hgb A1c MFr Bld: 8.3 % — ABNORMAL HIGH (ref 4.8–5.6)
MEAN PLASMA GLUCOSE: 192 mg/dL

## 2016-07-12 NOTE — Progress Notes (Signed)
Anesthesia Chart Review: Patient is a 60 year old male scheduled for left TKA on 07/23/16 by Dr. Wendall Stade.  PMH includes:  HTN, DM2 with neuropathy, OSA (does not use CPAP since weight loss), ADHD, GERD, skin cancer (BCC). Former smoker. BMI 37. S/P right knee arthroscopy 07/10/12 and left knee arthroscopy 10/27/15.   Medications include: ASA, Lipitor, flax seed oil, glimepiride, magnesium, lisinopril, metformin, methylphenidate, niacin, Protonix.   BP (!) 146/61   Pulse 79   Temp 36.7 C   Resp 20   Ht 5\' 8"  (1.727 m)   Wt 243 lb 14.4 oz (110.6 kg)   SpO2 98%   BMI 37.08 kg/m   Preoperative labs reviewed.  UA negative leukocytes/nitrites. Cr 0.78. CBC, PT/PTT WNL. Glucose 162. A1c 8.3, consistent with mean average glucose on 192.  (I left a voice message with Angela Nevin at Dr. Jerald Kief office regarding A1c results since > 8.0.)   CXR 07/11/16: IMPRESSION: There is no active cardiopulmonary disease. Aortic atherosclerosis.  EKG 10/25/15: NSR, RSR prime or QR pattern in V1 suggests right ventricular conduction delay.   Patient will get a fasting CBG prior to surgery. If result is acceptable and otherwise no acute changes then I anticipate that he can proceed as planned.   George Hugh [facility] Short Stay Center/Anesthesiology Phone [phone] 07/12/2016 5:15 PM

## 2016-07-19 MED ORDER — SODIUM CHLORIDE 0.9 % IV SOLN
1500.0000 mg | INTRAVENOUS | Status: AC
  Administered 2016-07-23: 1500 mg via INTRAVENOUS
  Filled 2016-07-19: qty 1500

## 2016-07-19 NOTE — H&P (Signed)
TOTAL KNEE ADMISSION H&P  Patient is being admitted for left total knee arthroplasty.  Subjective:  Chief Complaint:left knee pain.  HPI: Jeff Edwards., 60 y.o. male, has a history of pain and functional disability in the left knee due to arthritis and has failed non-surgical conservative treatments for greater than 12 weeks to includeNSAID's and/or analgesics, corticosteriod injections, viscosupplementation injections, flexibility and strengthening excercises, use of assistive devices, weight reduction as appropriate and activity modification.  Onset of symptoms was gradual, starting 5 years ago with gradually worsening course since that time. The patient noted prior procedures on the knee to include  arthroscopy on the left knee(s).  Patient currently rates pain in the left knee(s) at 10 out of 10 with activity. Patient has night pain, worsening of pain with activity and weight bearing, pain that interferes with activities of daily living, crepitus and joint swelling.  Patient has evidence of subchondral cysts, subchondral sclerosis, periarticular osteophytes and joint space narrowing by imaging studies. There is no active infection.  Patient Active Problem List   Diagnosis Date Noted  . 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 Diabetes ([place]) 03/01/2013   Past Medical History:  Diagnosis Date  . ADHD (attention deficit hyperactivity disorder)   . Anemia    as a child  . 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 Cancer ([place])    basal cell carcinoma   . Diabetes mellitus    Type 2  . Diverticulitis   . GERD (gastroesophageal reflux disease)   . Hypertension   . Memory change   . Neuropathy ([place])    in feet  . Retinal detachment    right eye  . Sleep apnea    cpap does not use (study done 10+ yrs) - has lost weight    Past Surgical History:  Procedure Laterality Date  . ANKLE FRACTURE  SURGERY     broken and reset   age 49s  . BASAL CELL CARCINOMA EXCISION    . COLONOSCOPY    . KNEE ARTHROSCOPY  07/10/2012   Procedure: ARTHROSCOPY KNEE;  Surgeon: Newt Minion, MD;  Location: [facility];  Service: Orthopedics;  Laterality: Right;  Right knee arthroscopy and debridement, chrondralplasty  . KNEE ARTHROSCOPY Left 10/27/2015   Procedure: Left Knee Arthroscopy and Debridement;  Surgeon: Newt Minion, MD;  Location: [facility];  Service: Orthopedics;  Laterality: Left;  . SEPTOPLASTY     x2    No prescriptions prior to admission.   Allergies  Allergen Reactions  . Penicillins Other (See Comments)    Passed out as a child    Social History  Substance Use Topics  . Smoking status: Former Smoker    Quit date: 11/18/1984  . Smokeless tobacco: Never Used  . Alcohol use No    Family History  Problem Relation Age of Onset  . Alzheimer's disease Mother   . High blood pressure Mother   . Heart disease Maternal Grandfather   . Diabetes Sister      Review of Systems  Musculoskeletal: Positive for joint pain.       Left knee  All other systems reviewed and are negative.   Objective:  Physical Exam  Constitutional: He is oriented to person, place, and time. He appears well-developed and well-nourished.  HENT:  Head: Normocephalic and atraumatic.  Eyes: Pupils are equal, round, and reactive to light.  Neck: Normal range of  motion.  Cardiovascular: Normal rate and regular rhythm.   Respiratory: Effort normal.  GI: Soft.  Musculoskeletal:  Left knee motion is about 0-100.  He has some healed portal scars.  There is no effusion.  There is crepitation towards full extension.  Pain is both medial and patellofemoral.  Ligaments are stable.  Motion is full and straight leg raise is negative.  There is no palpable lymphadenopathy behind his knee.  He has normal unlabored respirations.  He does have a little bit of knee pain with hip rotation though motion is nearly full.     Neurological: He is alert and oriented to person, place, and time.  Skin: Skin is warm and dry.  Psychiatric: He has a normal mood and affect. His behavior is normal. Judgment and thought content normal.    Vital signs in last 24 hours:    Labs:   Estimated body mass index is 37.08 kg/m as calculated from the following:   Height as of 07/11/16: 5\' 8"  (1.727 m).   Weight as of 07/11/16: 110.6 kg (243 lb 14.4 oz).   Imaging Review Plain radiographs demonstrate severe degenerative joint disease of the left knee(s). The overall alignment isneutral. The bone quality appears to be good for age and reported activity level.  Assessment/Plan:  End stage primary arthritis, left knee   The patient history, physical examination, clinical judgment of the provider and imaging studies are consistent with end stage degenerative joint disease of the left knee(s) and total knee arthroplasty is deemed medically necessary. The treatment options including medical management, injection therapy arthroscopy and arthroplasty were discussed at length. The risks and benefits of total knee arthroplasty were presented and reviewed. The risks due to aseptic loosening, infection, stiffness, patella tracking problems, thromboembolic complications and other imponderables were discussed. The patient acknowledged the explanation, agreed to proceed with the plan and consent was signed. Patient is being admitted for inpatient treatment for surgery, pain control, PT, OT, prophylactic antibiotics, VTE prophylaxis, progressive ambulation and ADL's and discharge planning. The patient is planning to be discharged home with home health services

## 2016-07-23 ENCOUNTER — Inpatient Hospital Stay (HOSPITAL_COMMUNITY)
Admission: RE | Admit: 2016-07-23 | Discharge: 2016-07-26 | DRG: 470 | Disposition: A | Discharge status: Home / Self Care | Payer: 59 | Source: Ambulatory Visit | Attending: Orthopaedic Surgery | Admitting: Orthopaedic Surgery

## 2016-07-23 ENCOUNTER — Inpatient Hospital Stay (HOSPITAL_COMMUNITY): Payer: 59 | Admitting: Anesthesiology

## 2016-07-23 ENCOUNTER — Encounter (HOSPITAL_COMMUNITY): Payer: Self-pay | Admitting: *Deleted

## 2016-07-23 ENCOUNTER — Inpatient Hospital Stay (HOSPITAL_COMMUNITY): Payer: 59 | Admitting: Vascular Surgery

## 2016-07-23 ENCOUNTER — Encounter (HOSPITAL_COMMUNITY)
Admission: RE | Disposition: A | Discharge status: Home / Self Care | Payer: Self-pay | Source: Ambulatory Visit | Attending: Orthopaedic Surgery

## 2016-07-23 DIAGNOSIS — Z8249 Family history of ischemic heart disease and other diseases of the circulatory system: Secondary | ICD-10-CM | POA: Diagnosis not present

## 2016-07-23 DIAGNOSIS — M1712 Unilateral primary osteoarthritis, left knee: Principal | ICD-10-CM | POA: Diagnosis present

## 2016-07-23 DIAGNOSIS — F909 Attention-deficit hyperactivity disorder, unspecified type: Secondary | ICD-10-CM | POA: Diagnosis present

## 2016-07-23 DIAGNOSIS — H3321 Serous retinal detachment, right eye: Secondary | ICD-10-CM | POA: Diagnosis present

## 2016-07-23 DIAGNOSIS — Z87891 Personal history of nicotine dependence: Secondary | ICD-10-CM | POA: Diagnosis not present

## 2016-07-23 DIAGNOSIS — E119 Type 2 diabetes mellitus without complications: Secondary | ICD-10-CM

## 2016-07-23 DIAGNOSIS — K219 Gastro-esophageal reflux disease without esophagitis: Secondary | ICD-10-CM | POA: Diagnosis not present

## 2016-07-23 DIAGNOSIS — Z85828 Personal history of other malignant neoplasm of skin: Secondary | ICD-10-CM

## 2016-07-23 DIAGNOSIS — E1142 Type 2 diabetes mellitus with diabetic polyneuropathy: Secondary | ICD-10-CM | POA: Diagnosis present

## 2016-07-23 DIAGNOSIS — I10 Essential (primary) hypertension: Secondary | ICD-10-CM | POA: Diagnosis present

## 2016-07-23 DIAGNOSIS — Z880 Allergy status to penicillin: Secondary | ICD-10-CM | POA: Diagnosis not present

## 2016-07-23 DIAGNOSIS — M179 Osteoarthritis of knee, unspecified: Secondary | ICD-10-CM | POA: Diagnosis not present

## 2016-07-23 DIAGNOSIS — Z96652 Presence of left artificial knee joint: Secondary | ICD-10-CM | POA: Diagnosis not present

## 2016-07-23 DIAGNOSIS — M25562 Pain in left knee: Secondary | ICD-10-CM | POA: Diagnosis present

## 2016-07-23 DIAGNOSIS — R269 Unspecified abnormalities of gait and mobility: Secondary | ICD-10-CM | POA: Diagnosis not present

## 2016-07-23 HISTORY — DX: Basal cell carcinoma of skin of left upper limb, including shoulder: C44.619

## 2016-07-23 HISTORY — DX: Pure hypercholesterolemia, unspecified: E78.00

## 2016-07-23 HISTORY — DX: Type 2 diabetes mellitus without complications: E11.9

## 2016-07-23 HISTORY — DX: Bilateral primary osteoarthritis of knee: M17.0

## 2016-07-23 HISTORY — PX: TOTAL KNEE ARTHROPLASTY: SHX125

## 2016-07-23 LAB — GLUCOSE, CAPILLARY
GLUCOSE-CAPILLARY: 205 mg/dL — AB (ref 65–99)
GLUCOSE-CAPILLARY: 120 mg/dL — AB (ref 65–99)
Glucose-Capillary: 129 mg/dL — ABNORMAL HIGH (ref 65–99)
Glucose-Capillary: 145 mg/dL — ABNORMAL HIGH (ref 65–99)

## 2016-07-23 SURGERY — ARTHROPLASTY, KNEE, TOTAL
Anesthesia: Monitor Anesthesia Care | Site: Knee | Laterality: Left

## 2016-07-23 MED ORDER — TRANEXAMIC ACID 1000 MG/10ML IV SOLN
2000.0000 mg | INTRAVENOUS | Status: AC
  Administered 2016-07-23: 2000 mg via TOPICAL
  Filled 2016-07-23: qty 20

## 2016-07-23 MED ORDER — HYDROMORPHONE HCL 1 MG/ML IJ SOLN
0.2500 mg | INTRAMUSCULAR | Status: DC | PRN
  Administered 2016-07-23 (×2): 0.5 mg via INTRAVENOUS

## 2016-07-23 MED ORDER — BUPIVACAINE-EPINEPHRINE (PF) 0.5% -1:200000 IJ SOLN
INTRAMUSCULAR | Status: AC
  Filled 2016-07-23: qty 30

## 2016-07-23 MED ORDER — OXYCODONE HCL 5 MG PO TABS
5.0000 mg | ORAL_TABLET | ORAL | Status: DC | PRN
  Administered 2016-07-23 – 2016-07-24 (×5): 10 mg via ORAL
  Filled 2016-07-23 (×5): qty 2

## 2016-07-23 MED ORDER — ALUM & MAG HYDROXIDE-SIMETH 200-200-20 MG/5ML PO SUSP: 30.0000 mL | ORAL | Status: DC | PRN

## 2016-07-23 MED ORDER — HYDROMORPHONE HCL 1 MG/ML IJ SOLN
INTRAMUSCULAR | Status: AC
  Filled 2016-07-23: qty 1

## 2016-07-23 MED ORDER — ONDANSETRON HCL 4 MG PO TABS
4.0000 mg | ORAL_TABLET | Freq: Four times a day (QID) | ORAL | Status: DC | PRN
  Administered 2016-07-23: 4 mg via ORAL
  Filled 2016-07-23: qty 1

## 2016-07-23 MED ORDER — FENTANYL CITRATE (PF) 100 MCG/2ML IJ SOLN
INTRAMUSCULAR | Status: AC
  Filled 2016-07-23: qty 4

## 2016-07-23 MED ORDER — TRANEXAMIC ACID 1000 MG/10ML IV SOLN
1000.0000 mg | Freq: Once | INTRAVENOUS | Status: AC
  Administered 2016-07-23: 1000 mg via INTRAVENOUS
  Filled 2016-07-23: qty 10

## 2016-07-23 MED ORDER — TRANEXAMIC ACID 1000 MG/10ML IV SOLN
1000.0000 mg | INTRAVENOUS | Status: AC
  Administered 2016-07-23: 1000 mg via INTRAVENOUS
  Filled 2016-07-23: qty 10

## 2016-07-23 MED ORDER — OXYCODONE HCL 5 MG/5ML PO SOLN: 5.0000 mg | Freq: Once | ORAL | Status: DC | PRN

## 2016-07-23 MED ORDER — METFORMIN HCL 500 MG PO TABS
1000.0000 mg | ORAL_TABLET | Freq: Two times a day (BID) | ORAL | Status: DC
  Administered 2016-07-23 – 2016-07-26 (×6): 1000 mg via ORAL
  Filled 2016-07-23 (×6): qty 2

## 2016-07-23 MED ORDER — MIDAZOLAM HCL 2 MG/2ML IJ SOLN
INTRAMUSCULAR | Status: AC
  Filled 2016-07-23: qty 2

## 2016-07-23 MED ORDER — DEXTROSE 5 % IV SOLN
500.0000 mg | Freq: Four times a day (QID) | INTRAVENOUS | Status: DC | PRN
  Filled 2016-07-23: qty 5

## 2016-07-23 MED ORDER — LISINOPRIL 5 MG PO TABS
5.0000 mg | ORAL_TABLET | Freq: Every evening | ORAL | Status: DC
  Administered 2016-07-23 – 2016-07-25 (×3): 5 mg via ORAL
  Filled 2016-07-23 (×3): qty 1

## 2016-07-23 MED ORDER — BUPIVACAINE LIPOSOME 1.3 % IJ SUSP
20.0000 mL | INTRAMUSCULAR | Status: AC
  Administered 2016-07-23: 20 mL
  Filled 2016-07-23: qty 20

## 2016-07-23 MED ORDER — PROPOFOL 500 MG/50ML IV EMUL
INTRAVENOUS | Status: DC | PRN
  Administered 2016-07-23: 100 ug/kg/min via INTRAVENOUS

## 2016-07-23 MED ORDER — HYDROMORPHONE HCL 1 MG/ML IJ SOLN
0.5000 mg | INTRAMUSCULAR | Status: DC | PRN
  Administered 2016-07-23 – 2016-07-26 (×6): 1 mg via INTRAVENOUS
  Filled 2016-07-23 (×7): qty 1

## 2016-07-23 MED ORDER — EPHEDRINE SULFATE 50 MG/ML IJ SOLN
INTRAMUSCULAR | Status: DC | PRN
  Administered 2016-07-23 (×2): 5 mg via INTRAVENOUS

## 2016-07-23 MED ORDER — METHOCARBAMOL 500 MG PO TABS
500.0000 mg | ORAL_TABLET | Freq: Four times a day (QID) | ORAL | Status: DC | PRN
  Administered 2016-07-23 – 2016-07-26 (×10): 500 mg via ORAL
  Filled 2016-07-23 (×10): qty 1

## 2016-07-23 MED ORDER — CHLORHEXIDINE GLUCONATE 4 % EX LIQD
60.0000 mL | Freq: Once | CUTANEOUS | Status: DC
Start: 2016-07-23 — End: 2016-07-23

## 2016-07-23 MED ORDER — INSULIN ASPART 100 UNIT/ML ~~LOC~~ SOLN
0.0000 [IU] | Freq: Three times a day (TID) | SUBCUTANEOUS | Status: DC
  Administered 2016-07-23: 7 [IU] via SUBCUTANEOUS
  Administered 2016-07-24 – 2016-07-26 (×6): 4 [IU] via SUBCUTANEOUS

## 2016-07-23 MED ORDER — BISACODYL 5 MG PO TBEC: 5.0000 mg | DELAYED_RELEASE_TABLET | Freq: Every day | ORAL | Status: DC | PRN

## 2016-07-23 MED ORDER — LACTATED RINGERS IV SOLN
INTRAVENOUS | Status: DC
  Administered 2016-07-24: 13:00:00 via INTRAVENOUS

## 2016-07-23 MED ORDER — NIACIN ER 500 MG PO TBCR
500.0000 mg | EXTENDED_RELEASE_TABLET | Freq: Every day | ORAL | Status: DC
  Administered 2016-07-25: 500 mg via ORAL
  Filled 2016-07-23 (×3): qty 1

## 2016-07-23 MED ORDER — PHENYLEPHRINE HCL 10 MG/ML IJ SOLN
INTRAMUSCULAR | Status: DC | PRN
  Administered 2016-07-23: 80 ug via INTRAVENOUS

## 2016-07-23 MED ORDER — 0.9 % SODIUM CHLORIDE (POUR BTL) OPTIME
TOPICAL | Status: DC | PRN
  Administered 2016-07-23: 1000 mL

## 2016-07-23 MED ORDER — BUPIVACAINE-EPINEPHRINE (PF) 0.25% -1:200000 IJ SOLN
INTRAMUSCULAR | Status: AC
  Filled 2016-07-23: qty 30

## 2016-07-23 MED ORDER — BUPIVACAINE IN DEXTROSE 0.75-8.25 % IT SOLN
INTRATHECAL | Status: DC | PRN
  Administered 2016-07-23: 2 mL via INTRATHECAL

## 2016-07-23 MED ORDER — SODIUM CHLORIDE 0.9 % IJ SOLN
INTRAMUSCULAR | Status: DC | PRN
  Administered 2016-07-23 (×2): 10 mL

## 2016-07-23 MED ORDER — PANTOPRAZOLE SODIUM 40 MG PO TBEC
40.0000 mg | DELAYED_RELEASE_TABLET | Freq: Every day | ORAL | Status: DC
  Administered 2016-07-24 – 2016-07-26 (×3): 40 mg via ORAL
  Filled 2016-07-23 (×3): qty 1

## 2016-07-23 MED ORDER — PHENOL 1.4 % MT LIQD
1.0000 | OROMUCOSAL | Status: DC | PRN
Start: 2016-07-23 — End: 2016-07-26

## 2016-07-23 MED ORDER — BUPIVACAINE-EPINEPHRINE (PF) 0.5% -1:200000 IJ SOLN
INTRAMUSCULAR | Status: DC | PRN
  Administered 2016-07-23: 20 mL

## 2016-07-23 MED ORDER — VANCOMYCIN HCL 10 G IV SOLR
1500.0000 mg | Freq: Two times a day (BID) | INTRAVENOUS | Status: AC
  Administered 2016-07-23: 1500 mg via INTRAVENOUS
  Filled 2016-07-23: qty 1500

## 2016-07-23 MED ORDER — DOCUSATE SODIUM 100 MG PO CAPS
100.0000 mg | ORAL_CAPSULE | Freq: Two times a day (BID) | ORAL | Status: DC
  Administered 2016-07-23 – 2016-07-26 (×5): 100 mg via ORAL
  Filled 2016-07-23 (×7): qty 1

## 2016-07-23 MED ORDER — METOCLOPRAMIDE HCL 5 MG/ML IJ SOLN: 5.0000 mg | Freq: Three times a day (TID) | INTRAMUSCULAR | Status: DC | PRN

## 2016-07-23 MED ORDER — ASPIRIN EC 325 MG PO TBEC
325.0000 mg | DELAYED_RELEASE_TABLET | Freq: Two times a day (BID) | ORAL | Status: DC
  Administered 2016-07-24 – 2016-07-26 (×5): 325 mg via ORAL
  Filled 2016-07-23 (×5): qty 1

## 2016-07-23 MED ORDER — SODIUM CHLORIDE 0.9 % IR SOLN
Status: DC | PRN
  Administered 2016-07-23: 3000 mL

## 2016-07-23 MED ORDER — GLIMEPIRIDE 4 MG PO TABS
2.0000 mg | ORAL_TABLET | Freq: Every day | ORAL | Status: DC
  Administered 2016-07-24 – 2016-07-26 (×3): 2 mg via ORAL
  Filled 2016-07-23 (×3): qty 1

## 2016-07-23 MED ORDER — LACTATED RINGERS IV SOLN
INTRAVENOUS | Status: DC
  Administered 2016-07-23: 12:00:00 via INTRAVENOUS

## 2016-07-23 MED ORDER — ONDANSETRON HCL 4 MG/2ML IJ SOLN
INTRAMUSCULAR | Status: DC | PRN
  Administered 2016-07-23: 4 mg via INTRAVENOUS

## 2016-07-23 MED ORDER — LORATADINE 10 MG PO TABS
10.0000 mg | ORAL_TABLET | Freq: Every day | ORAL | Status: DC
  Administered 2016-07-23 – 2016-07-26 (×4): 10 mg via ORAL
  Filled 2016-07-23 (×4): qty 1

## 2016-07-23 MED ORDER — BUPIVACAINE-EPINEPHRINE (PF) 0.5% -1:200000 IJ SOLN
INTRAMUSCULAR | Status: AC
  Filled 2016-07-23: qty 1.8

## 2016-07-23 MED ORDER — ACETAMINOPHEN 650 MG RE SUPP: 650.0000 mg | Freq: Four times a day (QID) | RECTAL | Status: DC | PRN

## 2016-07-23 MED ORDER — DIPHENHYDRAMINE HCL 12.5 MG/5ML PO ELIX: 12.5000 mg | ORAL_SOLUTION | ORAL | Status: DC | PRN

## 2016-07-23 MED ORDER — FENTANYL CITRATE (PF) 100 MCG/2ML IJ SOLN
INTRAMUSCULAR | Status: DC | PRN
  Administered 2016-07-23: 50 ug via INTRAVENOUS
  Administered 2016-07-23: 100 ug via INTRAVENOUS
  Administered 2016-07-23: 50 ug via INTRAVENOUS

## 2016-07-23 MED ORDER — ATORVASTATIN CALCIUM 20 MG PO TABS
20.0000 mg | ORAL_TABLET | Freq: Every evening | ORAL | Status: DC
  Administered 2016-07-23 – 2016-07-25 (×3): 20 mg via ORAL
  Filled 2016-07-23 (×3): qty 1

## 2016-07-23 MED ORDER — ONDANSETRON HCL 4 MG/2ML IJ SOLN
4.0000 mg | Freq: Four times a day (QID) | INTRAMUSCULAR | Status: DC | PRN
  Administered 2016-07-24 – 2016-07-26 (×2): 4 mg via INTRAVENOUS
  Filled 2016-07-23 (×2): qty 2

## 2016-07-23 MED ORDER — METOCLOPRAMIDE HCL 5 MG PO TABS: 5.0000 mg | ORAL_TABLET | Freq: Three times a day (TID) | ORAL | Status: DC | PRN

## 2016-07-23 MED ORDER — MENTHOL 3 MG MT LOZG: 1.0000 | LOZENGE | OROMUCOSAL | Status: DC | PRN

## 2016-07-23 MED ORDER — LACTATED RINGERS IV SOLN
INTRAVENOUS | Status: DC | PRN
  Administered 2016-07-23: 07:00:00 via INTRAVENOUS

## 2016-07-23 MED ORDER — ACETAMINOPHEN 325 MG PO TABS: 650.0000 mg | ORAL_TABLET | Freq: Four times a day (QID) | ORAL | Status: DC | PRN

## 2016-07-23 MED ORDER — SODIUM CHLORIDE 0.9 % IV SOLN
INTRAVENOUS | Status: DC | PRN
  Administered 2016-07-23: 08:00:00 via INTRAVENOUS

## 2016-07-23 MED ORDER — MIDAZOLAM HCL 5 MG/5ML IJ SOLN
INTRAMUSCULAR | Status: DC | PRN
  Administered 2016-07-23 (×2): 1 mg via INTRAVENOUS

## 2016-07-23 MED ORDER — OXYCODONE HCL 5 MG PO TABS: 5.0000 mg | ORAL_TABLET | Freq: Once | ORAL | Status: DC | PRN

## 2016-07-23 MED ORDER — ONDANSETRON HCL 4 MG/2ML IJ SOLN: 4.0000 mg | Freq: Four times a day (QID) | INTRAMUSCULAR | Status: DC | PRN

## 2016-07-23 MED FILL — TRUE METRIX GLUCOSE TEST ST: 90 days supply | Qty: 100 | Fill #1

## 2016-07-23 SURGICAL SUPPLY — 72 items
BAG DECANTER FOR FLEXI CONT (MISCELLANEOUS) ×1 IMPLANT
BANDAGE ACE 4X5 VEL STRL LF (GAUZE/BANDAGES/DRESSINGS) ×2 IMPLANT
BANDAGE ESMARK 6X9 LF (GAUZE/BANDAGES/DRESSINGS) ×1 IMPLANT
BLADE SAGITTAL 25.0X1.19X90 (BLADE) ×1 IMPLANT
BLADE SAW SGTL 13.0X1.19X90.0M (BLADE) ×1 IMPLANT
BLADE SURG ROTATE 9660 (MISCELLANEOUS) IMPLANT
BNDG CMPR 9X6 STRL LF SNTH (GAUZE/BANDAGES/DRESSINGS) ×1
BNDG CMPR MED 10X6 ELC LF (GAUZE/BANDAGES/DRESSINGS) ×1
BNDG ELASTIC 6X10 VLCR STRL LF (GAUZE/BANDAGES/DRESSINGS) ×2 IMPLANT
BNDG ESMARK 6X9 LF (GAUZE/BANDAGES/DRESSINGS) ×2
BNDG GAUZE ELAST 4 BULKY (GAUZE/BANDAGES/DRESSINGS) ×3 IMPLANT
BOWL SMART MIX CTS (DISPOSABLE) ×2 IMPLANT
CAP KNEE TOTAL 3 SIGMA ×1 IMPLANT
CEMENT HV SMART SET (Cement) ×4 IMPLANT
CLEANER TIP ELECTROSURG 2X2 (MISCELLANEOUS) ×1 IMPLANT
COVER SURGICAL LIGHT HANDLE (MISCELLANEOUS) ×2 IMPLANT
CUFF TOURNIQUET SINGLE 34IN LL (TOURNIQUET CUFF) ×3 IMPLANT
CUFF TOURNIQUET SINGLE 44IN (TOURNIQUET CUFF) IMPLANT
DECANTER SPIKE VIAL GLASS SM (MISCELLANEOUS) ×2 IMPLANT
DRAPE EXTREMITY T 121X128X90 (DRAPE) ×2 IMPLANT
DRAPE PROXIMA HALF (DRAPES) ×2 IMPLANT
DRAPE U-SHAPE 47X51 STRL (DRAPES) ×2 IMPLANT
DRSG ADAPTIC 3X8 NADH LF (GAUZE/BANDAGES/DRESSINGS) ×2 IMPLANT
DRSG PAD ABDOMINAL 8X10 ST (GAUZE/BANDAGES/DRESSINGS) ×2 IMPLANT
DURAPREP 26ML APPLICATOR (WOUND CARE) ×2 IMPLANT
ELECT REM PT RETURN 9FT ADLT (ELECTROSURGICAL) ×2
ELECTRODE REM PT RTRN 9FT ADLT (ELECTROSURGICAL) ×1 IMPLANT
GAUZE SPONGE 4X4 12PLY STRL (GAUZE/BANDAGES/DRESSINGS) ×2 IMPLANT
GLOVE BIO SURGEON STRL SZ7 (GLOVE) ×2 IMPLANT
GLOVE BIO SURGEON STRL SZ8 (GLOVE) ×4 IMPLANT
GLOVE BIOGEL PI IND STRL 7.5 (GLOVE) IMPLANT
GLOVE BIOGEL PI IND STRL 8 (GLOVE) ×2 IMPLANT
GLOVE BIOGEL PI INDICATOR 7.5 (GLOVE) ×2
GLOVE BIOGEL PI INDICATOR 8 (GLOVE) ×2
GLOVE SURG SS PI 6.5 STRL IVOR (GLOVE) ×1 IMPLANT
GOWN STRL REUS W/ TWL LRG LVL3 (GOWN DISPOSABLE) ×1 IMPLANT
GOWN STRL REUS W/ TWL XL LVL3 (GOWN DISPOSABLE) ×2 IMPLANT
GOWN STRL REUS W/TWL LRG LVL3 (GOWN DISPOSABLE) ×8
GOWN STRL REUS W/TWL XL LVL3 (GOWN DISPOSABLE) ×4
HANDPIECE INTERPULSE COAX TIP (DISPOSABLE) ×2
HOOD PEEL AWAY FACE SHEILD DIS (HOOD) ×4 IMPLANT
IMMOBILIZER KNEE 22 UNIV (SOFTGOODS) ×2 IMPLANT
KIT BASIN OR (CUSTOM PROCEDURE TRAY) ×2 IMPLANT
KIT ROOM TURNOVER OR (KITS) ×2 IMPLANT
MANIFOLD NEPTUNE II (INSTRUMENTS) ×2 IMPLANT
NDL HYPO 21X1 ECLIPSE (NEEDLE) ×1 IMPLANT
NEEDLE HYPO 21X1 ECLIPSE (NEEDLE) ×2 IMPLANT
NS IRRIG 1000ML POUR BTL (IV SOLUTION) ×2 IMPLANT
PACK TOTAL JOINT (CUSTOM PROCEDURE TRAY) ×2 IMPLANT
PACK UNIVERSAL I (CUSTOM PROCEDURE TRAY) ×2 IMPLANT
PAD ABD 8X10 STRL (GAUZE/BANDAGES/DRESSINGS) ×2 IMPLANT
PAD ARMBOARD 7.5X6 YLW CONV (MISCELLANEOUS) ×4 IMPLANT
PENCIL BUTTON HOLSTER BLD 10FT (ELECTRODE) ×1 IMPLANT
SET HNDPC FAN SPRY TIP SCT (DISPOSABLE) ×1 IMPLANT
STAPLER VISISTAT 35W (STAPLE) IMPLANT
STRIP CLOSURE SKIN 1/2X4 (GAUZE/BANDAGES/DRESSINGS) ×2 IMPLANT
SUCTION FRAZIER HANDLE 10FR (MISCELLANEOUS) ×1
SUCTION TUBE FRAZIER 10FR DISP (MISCELLANEOUS) IMPLANT
SUT MNCRL AB 3-0 PS2 18 (SUTURE) ×1 IMPLANT
SUT VIC AB 0 CT1 27 (SUTURE) ×4
SUT VIC AB 0 CT1 27XBRD ANBCTR (SUTURE) ×2 IMPLANT
SUT VIC AB 1 CT1 27 (SUTURE) ×2
SUT VIC AB 1 CT1 27XBRD ANBCTR (SUTURE) IMPLANT
SUT VIC AB 2-0 CT1 27 (SUTURE) ×4
SUT VIC AB 2-0 CT1 TAPERPNT 27 (SUTURE) ×2 IMPLANT
SUT VLOC 180 0 24IN GS25 (SUTURE) ×2 IMPLANT
SYR 50ML LL SCALE MARK (SYRINGE) ×2 IMPLANT
TOWEL OR 17X24 6PK STRL BLUE (TOWEL DISPOSABLE) ×2 IMPLANT
TOWEL OR 17X26 10 PK STRL BLUE (TOWEL DISPOSABLE) ×2 IMPLANT
TRAY FOLEY CATH 14FR (SET/KITS/TRAYS/PACK) IMPLANT
UPCHARGE REV TRAY MBT KNEE ×1 IMPLANT
WATER STERILE IRR 1000ML POUR (IV SOLUTION) ×4 IMPLANT

## 2016-07-23 NOTE — Progress Notes (Signed)
Orthopedic Tech Progress Note Patient Details:  Jeff Edwards. [birth year] [id number]  CPM Left Knee CPM Left Knee: On Left Knee Flexion (Degrees): 90 Left Knee Extension (Degrees): 0 Additional Comments: Trapeze bar    [name] 07/23/2016, 10:56 AM

## 2016-07-23 NOTE — Transfer of Care (Signed)
Immediate Anesthesia Transfer of Care Note  Patient: Jeff Edwards.  Procedure(s) Performed: Procedure(s): LEFT TOTAL KNEE ARTHROPLASTY (Left)  Patient Location: PACU  Anesthesia Type:MAC and Spinal  Level of Consciousness: awake, alert , oriented, patient cooperative and responds to stimulation  Airway & Oxygen Therapy: Patient Spontanous Breathing  Post-op Assessment: Report given to RN and Post -op Vital signs reviewed and stable  Post vital signs: Reviewed and stable  Last Vitals:  Vitals:   07/23/16 0631 07/23/16 1005  BP: 131/78 100/74  Pulse: 74 77  Resp: 20 15  Temp: 37.3 C 36.5 C    Last Pain:  Vitals:   07/23/16 1005  TempSrc:   PainSc: 0-No pain      Patients Stated Pain Goal: 3 ([id number])  Complications: No apparent anesthesia complications

## 2016-07-23 NOTE — Anesthesia Procedure Notes (Signed)
Procedure Name: MAC Date/Time: 07/23/2016 7:40 AM Performed by: Jacquiline Doe A Pre-anesthesia Checklist: Patient identified, Emergency Drugs available, Suction available and Patient being monitored Patient Re-evaluated:Patient Re-evaluated prior to inductionOxygen Delivery Method: Nasal cannula and Simple face mask Intubation Type: IV induction Placement Confirmation: positive ETCO2 Dental Injury: Teeth and Oropharynx as per pre-operative assessment

## 2016-07-23 NOTE — Anesthesia Preprocedure Evaluation (Signed)
Anesthesia Evaluation  Patient identified by MRN, date of birth, ID band Patient awake    Reviewed: Allergy & Precautions, NPO status , Patient's Chart, lab work & pertinent test results  Airway Mallampati: II   Neck ROM: full    Dental   Pulmonary sleep apnea , former smoker,    breath sounds clear to auscultation       Cardiovascular hypertension,  Rhythm:regular Rate:Normal     Neuro/Psych    GI/Hepatic GERD  ,  Endo/Other  diabetes, Type 2  Renal/GU      Musculoskeletal  (+) Arthritis ,   Abdominal   Peds  Hematology  (+) anemia ,   Anesthesia Other Findings   Reproductive/Obstetrics                             Anesthesia Physical Anesthesia Plan  ASA: II  Anesthesia Plan: MAC and Spinal   Post-op Pain Management:    Induction: Intravenous  Airway Management Planned: Simple Face Mask  Additional Equipment:   Intra-op Plan:   Post-operative Plan:   Informed Consent: I have reviewed the patients History and Physical, chart, labs and discussed the procedure including the risks, benefits and alternatives for the proposed anesthesia with the patient or authorized representative who has indicated his/her understanding and acceptance.     Plan Discussed with: CRNA, Anesthesiologist and Surgeon  Anesthesia Plan Comments:         Anesthesia Quick Evaluation

## 2016-07-23 NOTE — Evaluation (Signed)
Physical Therapy Evaluation Patient Details Name: Jeff Edwards. MRN: [id number] DOB: 1956-09-30 Today's Date: 07/23/2016   History of Present Illness  60 y.o. male admitted for L TKA. PMH of B knee arthroscopies, ADD, DM, neuropathy  Clinical Impression  Pt is s/p TKA resulting in the deficits listed below (see PT Problem List). Min assist to transfer bed to recliner with RW, good progress expected.  Pt will benefit from skilled PT to increase their independence and safety with mobility to allow discharge to the venue listed below.      Follow Up Recommendations Home health PT    Equipment Recommendations  Rolling walker with 5" wheels;3in1 (PT);Other (comment) (transfer tub bench)    Recommendations for Other Services OT consult     Precautions / Restrictions Precautions Precautions: Knee;Fall Precaution Comments: 2 falls in past year  Required Braces or Orthoses: Knee Immobilizer - Left Knee Immobilizer - Left: Discontinue once straight leg raise with < 10 degree lag Restrictions Weight Bearing Restrictions: No LLE Weight Bearing: Weight bearing as tolerated      Mobility  Bed Mobility Overal bed mobility: Needs Assistance Bed Mobility: Supine to Sit     Supine to sit: Min guard     General bed mobility comments: instructed pt to self assist LLE with RLE, min/guard for LLE, used bedrail  Transfers Overall transfer level: Needs assistance Equipment used: Rolling walker (2 wheeled) Transfers: Sit to/from Stand Sit to Stand: Min assist;From elevated surface         General transfer comment: VCs hand placement  Ambulation/Gait Ambulation/Gait assistance: Min guard Ambulation Distance (Feet): 3 Feet Assistive device: Rolling walker (2 wheeled) Gait Pattern/deviations: Step-to pattern;Decreased step length - left;Decreased stance time - left   Gait velocity interpretation: Below normal speed for age/gender General Gait Details: VCs for sequencing, L KI  needed 2* L knee buckling  Stairs            Wheelchair Mobility    Modified Rankin (Stroke Patients Only)       Balance Overall balance assessment: Needs assistance   Sitting balance-Leahy Scale: Good       Standing balance-Leahy Scale: Poor                               Pertinent Vitals/Pain Pain Assessment: 0-10 Pain Score: 5  Pain Location: L knee with activity Pain Descriptors / Indicators: Sore Pain Intervention(s): Limited activity within patient's tolerance;Monitored during session;RN gave pain meds during session;Repositioned;Ice applied    Home Living Family/patient expects to be discharged to:: Private residence Living Arrangements: Spouse/significant other Available Help at Discharge: Family;Available 24 hours/day Type of Home: House Home Access: Stairs to enter Entrance Stairs-Rails: None Entrance Stairs-Number of Steps: 3 Home Layout: One level Home Equipment: Cane - single point      Prior Function Level of Independence: Independent with assistive device(s)         Comments: walked with SPC     Hand Dominance        Extremity/Trunk Assessment   Upper Extremity Assessment: Overall WFL for tasks assessed           Lower Extremity Assessment: LLE deficits/detail   LLE Deficits / Details: SLR 2/5, ankle WNL, knee 10-40* AAROM, sensation intact to light touch  Cervical / Trunk Assessment: Normal  Communication   Communication: No difficulties  Cognition Arousal/Alertness: Awake/alert Behavior During Therapy: WFL for tasks assessed/performed Overall Cognitive Status: Within Functional Limits  for tasks assessed                      General Comments      Exercises Total Joint Exercises Ankle Circles/Pumps: AROM;Both;10 reps Quad Sets: AROM;Both;5 reps;Supine Heel Slides: AAROM;10 reps;Left;Supine Long Arc Quad: AAROM;Left;5 reps;Seated Goniometric ROM: 10-40* AAROM L knee      Assessment/Plan     PT Assessment Patient needs continued PT services  PT Diagnosis Difficulty walking;Acute pain   PT Problem List Decreased strength;Decreased range of motion;Decreased activity tolerance;Pain;Decreased mobility;Decreased knowledge of use of DME  PT Treatment Interventions DME instruction;Gait training;Functional mobility training;Stair training;Therapeutic exercise;Therapeutic activities;Patient/family education   PT Goals (Current goals can be found in the Care Plan section) Acute Rehab PT Goals Patient Stated Goal: [place] PT Goal Formulation: With patient Time For Goal Achievement: 08/06/16 Potential to Achieve Goals: Good    Frequency 7X/week   Barriers to discharge        Co-evaluation               End of Session Equipment Utilized During Treatment: Gait belt Activity Tolerance: Patient tolerated treatment well Patient left: in chair;with call bell/phone within reach Nurse Communication: Mobility status         Time: [id number] PT Time Calculation (min) (ACUTE ONLY): 37 min   Charges:   PT Evaluation $PT Eval Low Complexity: 1 Procedure PT Treatments $Therapeutic Exercise: 8-22 mins   PT G Codes:        Philomena Doheny 07/23/2016, 2:47 PM [phone]

## 2016-07-23 NOTE — Anesthesia Postprocedure Evaluation (Signed)
Anesthesia Post Note  Patient: Jeff Edwards.  Procedure(s) Performed: Procedure(s) (LRB): LEFT TOTAL KNEE ARTHROPLASTY (Left)  Patient location during evaluation: PACU Anesthesia Type: Spinal Level of consciousness: oriented and awake and alert Pain management: pain level controlled Vital Signs Assessment: post-procedure vital signs reviewed and stable Respiratory status: spontaneous breathing, respiratory function stable and patient connected to nasal cannula oxygen Cardiovascular status: blood pressure returned to baseline and stable Postop Assessment: no headache and no backache Anesthetic complications: no    Last Vitals:  Vitals:   07/23/16 1150 07/23/16 1205  BP: 115/80 129/80  Pulse: 73 74  Resp: 13 18  Temp:  36.7 C    Last Pain:  Vitals:   07/23/16 1205  TempSrc:   PainSc: 3     LLE Motor Response: Responds to commands;Purposeful movement (07/23/16 1205) LLE Sensation: Decreased (07/23/16 1205) RLE Motor Response: Responds to commands;Purposeful movement (07/23/16 1205) RLE Sensation: Decreased (07/23/16 1205) L Sensory Level: S1-Sole of foot, [place] toes (07/23/16 1205) R Sensory Level: S1-Sole of foot, [place] toes (07/23/16 1205)  Reginal Lutes

## 2016-07-23 NOTE — Progress Notes (Signed)
Orthopedic Tech Progress Note Patient Details:  Jeff Edwards. 09-11-1956 [id number]  CPM Left Knee CPM Left Knee: On Left Knee Flexion (Degrees): 90 Left Knee Extension (Degrees): 0 Additional Comments: Trapeze bar    [name] 07/23/2016, 7:01 PM

## 2016-07-23 NOTE — Op Note (Signed)
PREOP DIAGNOSIS: DJD LEFT KNEE POSTOP DIAGNOSIS:  same PROCEDURE: LEFT TKR ANESTHESIA: Spinal and MAC ATTENDING SURGEON: Versa Craton G ASSISTANT: Jeff Dolly PA  INDICATIONS FOR PROCEDURE: Jeff Edwards. is a 60 y.o. male who has struggled for a long time with pain due to degenerative arthritis of the left knee.  The patient has failed many conservative non-operative measures and at this point has pain which limits the ability to sleep and walk.  The patient is offered total knee replacement.  Informed operative consent was obtained after discussion of possible risks of anesthesia, infection, neurovascular injury, DVT, and death.  The importance of the post-operative rehabilitation protocol to optimize result was stressed extensively with the patient.  SUMMARY OF FINDINGS AND PROCEDURE:  Jeff Edwards. was taken to the operative suite where under the above anesthesia a left knee replacement was performed.  There were advanced degenerative changes and the bone quality was excellent.  We used the DePuyLCS system and placed size standard plus femur, 5 MBT revision tibia, 41 mm all polyethylene patella, and a size 10 mm spacer.  Jeff Dolly PA-C assisted throughout and was invaluable to the completion of the case in that he helped retract and maintain exposure while I placed the components.  He also helped close thereby minimizing OR time.  The patient was admitted for appropriate post-op care to include perioperative antibiotics and mechanical and pharmacologic measures for DVT prophylaxis.  DESCRIPTION OF PROCEDURE:  Jeff Norris. was taken to the operative suite where the above anesthesia was applied.  The patient was positioned supine and prepped and draped in normal sterile fashion.  An appropriate time out was performed.  After the administration of vancomycin pre-op antibiotic the leg was elevated and exsanguinated and a tourniquet inflated.  A standard longitudinal incision was made on  the anterior knee.  Dissection was carried down to the extensor mechanism.  All appropriate anti-infective measures were used including the pre-operative antibiotic, betadine impregnated drape, and closed hooded exhaust systems for each member of the surgical team.  A medial parapatellar incision was made in the extensor mechanism and the knee cap flipped and the knee flexed.  Some residual meniscal tissues were removed along with any remaining ACL/PCL tissue.  A guide was placed on the tibia and a flat cut was made on it's superior surface.  An intramedullary guide was placed in the femur and was utilized to make anterior and posterior cuts creating an appropriate flexion gap.  A second intramedullary guide was placed in the femur to make a distal cut properly balancing the knee with an extension gap equal to the flexion gap.  The three bones sized to the above mentioned sizes and the appropriate guides were placed and utilized.  A trial reduction was done and the knee easily came to full extension and the patella tracked well on flexion.  The trial components were removed and all bones were cleaned with pulsatile lavage and then dried thoroughly.  Cement was mixed and was pressurized onto the bones followed by placement of the aforementioned components.  Excess cement was trimmed and pressure was held on the components until the cement had hardened.  The tourniquet was deflated and a Yazzie amount of bleeding was controlled with cautery and pressure.  The knee was irrigated thoroughly.  The extensor mechanism was re-approximated with V-loc suture in running fashion.  The knee was flexed and the repair was solid.  The subcutaneous tissues were re-approximated with #0 and #2-0 vicryl and  the skin closed with a subcuticular stitch and steristrips.  A sterile dressing was applied.  Intraoperative fluids, EBL, and tourniquet time can be obtained from anesthesia records.  DISPOSITION:  The patient was taken to recovery  room in stable condition and admitted for appropriate post-op care to include peri-operative antibiotic and DVT prophylaxis with mechanical and pharmacologic measures.  [name] G 07/23/2016, 9:31 AM

## 2016-07-23 NOTE — Anesthesia Procedure Notes (Addendum)
Spinal  Patient location during procedure: OR Start time: 07/23/2016 7:40 AM End time: 07/23/2016 7:53 AM Staffing Anesthesiologist: Marcie Bal, ADAM Performed: anesthesiologist  Preanesthetic Checklist Completed: patient identified, site marked, surgical consent, pre-op evaluation, timeout performed, IV checked, risks and benefits discussed and monitors and equipment checked Spinal Block Patient position: sitting Prep: Betadine and site prepped and draped Patient monitoring: heart rate, cardiac monitor, continuous pulse ox and blood pressure Approach: midline Location: L3-4 Injection technique: single-shot Needle Needle type: Pencan  Needle gauge: 24 G Needle length: 10 cm Assessment Sensory level: T8 Additional Notes Pt tolerated the procedure well.

## 2016-07-23 NOTE — Interval H&P Note (Signed)
History and Physical Interval Note:  07/23/2016 7:15 AM  Jeff Edwards.  has presented today for surgery, with the diagnosis of LEFT KNEE DEGENERATIVE JOINT DISEASE  The various methods of treatment have been discussed with the patient and family. After consideration of risks, benefits and other options for treatment, the patient has consented to  Procedure(s): LEFT TOTAL KNEE ARTHROPLASTY (Left) as a surgical intervention .  The patient's history has been reviewed, patient examined, no change in status, stable for surgery.  I have reviewed the patient's chart and labs.  Questions were answered to the patient's satisfaction.     Syliva Mee G

## 2016-07-24 ENCOUNTER — Encounter (HOSPITAL_COMMUNITY): Payer: Self-pay | Admitting: Orthopaedic Surgery

## 2016-07-24 LAB — GLUCOSE, CAPILLARY
GLUCOSE-CAPILLARY: 156 mg/dL — AB (ref 65–99)
GLUCOSE-CAPILLARY: 151 mg/dL — AB (ref 65–99)
GLUCOSE-CAPILLARY: 153 mg/dL — AB (ref 65–99)
Glucose-Capillary: 141 mg/dL — ABNORMAL HIGH (ref 65–99)

## 2016-07-24 MED ORDER — OXYCODONE HCL 5 MG PO TABS
5.0000 mg | ORAL_TABLET | ORAL | Status: DC | PRN
  Administered 2016-07-24 – 2016-07-26 (×13): 10 mg via ORAL
  Filled 2016-07-24 (×13): qty 2

## 2016-07-24 NOTE — Evaluation (Signed)
Occupational Therapy Evaluation Patient Details Name: Jeff Edwards. MRN: [id number] DOB: Sep 16, 1956 Today's Date: 07/24/2016    History of Present Illness 60 y.o. male admitted for L TKA. PMH of B knee arthroscopies, ADD, DM, neuropathy   Clinical Impression   Pt is pleasant and willing to work with therapy. PTA Pt independent with assistive devices. He had limited ROM for dressing and bathing his LB PTA. Pt benefited from education about AE and would benefit from continued practice with AE kit. Pt also debating buying a tub bench, and should practice tub transfer with 3 in 1 and tub bench to see what is safest. Pt presents with the below deficits and would benefit from OT in the acute care setting to increase safety and independence in ADL.    Follow Up Recommendations  Supervision/Assistance - 24 hour;Home health OT    Equipment Recommendations  Other (comment) (to be recommended at next venue of care)    Recommendations for Other Services       Precautions / Restrictions Precautions Precautions: Knee;Fall Precaution Comments: 2 falls in past year  Required Braces or Orthoses: Knee Immobilizer - Left Knee Immobilizer - Left: Discontinue once straight leg raise with < 10 degree lag Restrictions Weight Bearing Restrictions: No LLE Weight Bearing: Weight bearing as tolerated      Mobility Bed Mobility Overal bed mobility: Needs Assistance Bed Mobility: Supine to Sit;Sit to Supine     Supine to sit: Min assist Sit to supine: Supervision   General bed mobility comments: use of bedrail, Pt using LLE with RLE  Transfers Overall transfer level: Needs assistance Equipment used: Rolling walker (2 wheeled) Transfers: Sit to/from Stand Sit to Stand: From elevated surface;Min guard              Balance                                            ADL Overall ADL's : Needs assistance/impaired Eating/Feeding: Set up;Sitting   Grooming: Wash/dry  hands;Wash/dry face;Oral care;Bed level       Lower Body Bathing: Maximal assistance;Sitting/lateral leans Lower Body Bathing Details (indicate cue type and reason): educated in long handled sponge Upper Body Dressing : Set up;Sitting   Lower Body Dressing: Maximal assistance;Sitting/lateral leans (verbally educated in Product/process development scientist)               Functional mobility during ADLs: Min guard General ADL Comments: Pt had limited abilities for LB dressing and bathing before surgery. Pt would benefit from continued education and practice with AE     Vision     Perception     Praxis      Pertinent Vitals/Pain Pain Assessment: 0-10 Pain Score: 6  Pain Location: L knee Pain Descriptors / Indicators: Sore Pain Intervention(s): Limited activity within patient's tolerance;Monitored during session     Hand Dominance Right   Extremity/Trunk Assessment Upper Extremity Assessment Upper Extremity Assessment: Overall WFL for tasks assessed   Lower Extremity Assessment Lower Extremity Assessment: LLE deficits/detail LLE Deficits / Details: decreased ROM and strength post op       Communication Communication Communication: No difficulties   Cognition Arousal/Alertness: Awake/alert Behavior During Therapy: WFL for tasks assessed/performed Overall Cognitive Status: Within Functional Limits for tasks assessed  General Comments       Exercises       Shoulder Instructions      Home Living Family/patient expects to be discharged to:: Private residence Living Arrangements: Spouse/significant other;Other relatives Available Help at Discharge: Family;Available 24 hours/day Type of Home: House Home Access: Stairs to enter [employer] of Steps: 3 Entrance Stairs-Rails: None Home Layout: One level     Bathroom Shower/Tub: Tub/shower unit Shower/tub characteristics: Architectural technologist: Handicapped  height Bathroom Accessibility: Yes How Accessible: Accessible via walker Home Equipment: [place] - single point          Prior Functioning/Environment Level of Independence: Independent with assistive device(s)        Comments: walked with SPC in the afternoon    OT Diagnosis: Acute pain   OT Problem List: Decreased strength;Decreased range of motion;Decreased activity tolerance;Impaired balance (sitting and/or standing);Decreased knowledge of use of DME or AE;Pain   OT Treatment/Interventions: Self-care/ADL training;Therapeutic exercise;DME and/or AE instruction;Therapeutic activities;Patient/family education;Balance training    OT Goals(Current goals can be found in the care plan section) Acute Rehab OT Goals Patient Stated Goal: to hike in the mountains OT Goal Formulation: With patient Time For Goal Achievement: 08/07/16 Potential to Achieve Goals: Good ADL Goals Pt Will Perform Grooming: with supervision;standing Pt Will Perform Upper Body Dressing: with modified independence;standing Pt Will Perform Lower Body Dressing: with mod assist;with adaptive equipment;sit to/from stand Pt Will Transfer to Toilet: with supervision;ambulating;bedside commode Pt Will Perform Toileting - Clothing Manipulation and hygiene: with supervision;sit to/from stand Pt Will Perform Tub/Shower Transfer: Tub transfer;with supervision;tub bench;3 in 1;rolling walker;ambulating  OT Frequency: Min 2X/week   Barriers to D/C:            Co-evaluation              End of Session Equipment Utilized During Treatment: Gait belt;Rolling walker CPM Left Knee CPM Left Knee: Off Nurse Communication: Mobility status  Activity Tolerance: Patient tolerated treatment well Patient left: in bed;with call bell/phone within reach   Time: 1524-1556 OT Time Calculation (min): 32 min Charges:  OT General Charges $OT Visit: 1 Procedure OT Evaluation $OT Eval Low Complexity: 1 Procedure OT  Treatments $Self Care/Home Management : 8-22 mins G-Codes:    Merri Ray Tayson Schnelle 08/15/16, 5:26 PM  Hulda Humphrey OTR/L [phone]

## 2016-07-24 NOTE — Progress Notes (Signed)
Subjective: 1 Day Post-Op Procedure(s) (LRB): LEFT TOTAL KNEE ARTHROPLASTY (Left)  Activity level:  wbat Diet tolerance:  ok Voiding:  ok Patient reports pain as mild and moderate.    Objective: Vital signs in last 24 hours: Temp:  [97.4 F (36.3 C)-99.1 F (37.3 C)] 99.1 F (37.3 C) (09/06 0630) Pulse Rate:  [66-90] 90 (09/06 0630) Resp:  [10-18] 18 (09/05 1205) BP: (100-144)/(70-80) 141/77 (09/06 0630) SpO2:  [92 %-98 %] 98 % (09/06 0630)  Labs: No results for input(s): HGB in the last 72 hours. No results for input(s): WBC, RBC, HCT, PLT in the last 72 hours. No results for input(s): NA, K, CL, CO2, BUN, CREATININE, GLUCOSE, CALCIUM in the last 72 hours. No results for input(s): LABPT, INR in the last 72 hours.  Physical Exam:  Neurologically intact ABD soft Neurovascular intact Sensation intact distally Intact pulses distally Dorsiflexion/Plantar flexion intact Incision: dressing C/D/I and no drainage No cellulitis present Compartment soft  Assessment/Plan:  1 Day Post-Op Procedure(s) (LRB): LEFT TOTAL KNEE ARTHROPLASTY (Left) Advance diet Up with therapy D/C IV fluids Discharge home with home health either tomorrow or Friday depending on how PT goes. Continue on asa 325mg  BID x 2 weeks post op. Follow up in office 2 weeks post op. I will change dressing to aquacel tomorrow.  Jaice Digioia, Larwance Sachs 07/24/2016, 7:43 AM

## 2016-07-24 NOTE — Progress Notes (Signed)
Physical Therapy Treatment Patient Details Name: Jeff Edwards. MRN: [id number] DOB: 23-Dec-1955 Today's Date: 07/24/2016    History of Present Illness 60 y.o. male admitted for L TKA. PMH of B knee arthroscopies, ADD, DM, neuropathy    PT Comments    Pt progressing with mobility, he ambulated 35' with RW, distance limited by dizziness and pain. Performed L TKA exercises with assistance. Pt reports L quads and hip muscles were weak prior to admission.   Follow Up Recommendations  Home health PT     Equipment Recommendations  Rolling walker with 5" wheels;3in1 (PT);Other (comment) (transfer tub bench)    Recommendations for Other Services OT consult     Precautions / Restrictions Precautions Precautions: Knee;Fall Precaution Comments: 2 falls in past year  Required Braces or Orthoses: Knee Immobilizer - Left Knee Immobilizer - Left: Discontinue once straight leg raise with < 10 degree lag Restrictions Weight Bearing Restrictions: No LLE Weight Bearing: Weight bearing as tolerated    Mobility  Bed Mobility Overal bed mobility: Needs Assistance Bed Mobility: Supine to Sit     Supine to sit: Min assist     General bed mobility comments: instructed pt to self assist LLE with RLE, min A for LLE, used bedrail  Transfers Overall transfer level: Needs assistance Equipment used: Rolling walker (2 wheeled) Transfers: Sit to/from Stand Sit to Stand: From elevated surface;Min guard         General transfer comment: VCs hand placement  Ambulation/Gait Ambulation/Gait assistance: Min guard Ambulation Distance (Feet): 12 Feet Assistive device: Rolling walker (2 wheeled) Gait Pattern/deviations: Step-to pattern;Decreased step length - left;Decreased step length - right;Decreased weight shift to left;Antalgic   Gait velocity interpretation: Below normal speed for age/gender General Gait Details: VCs for sequencing, ambulated with L KI, distance limited by  dizziness   Stairs            Wheelchair Mobility    Modified Rankin (Stroke Patients Only)       Balance     Sitting balance-Leahy Scale: Good       Standing balance-Leahy Scale: Poor                      Cognition Arousal/Alertness: Awake/alert Behavior During Therapy: WFL for tasks assessed/performed Overall Cognitive Status: Within Functional Limits for tasks assessed                      Exercises Total Joint Exercises Ankle Circles/Pumps: AROM;Both;10 reps Quad Sets: AROM;Both;Supine;10 reps Short Arc QuadSinclair Ship;Left;10 reps;Supine Heel Slides: AAROM;10 reps;Left;Supine Hip ABduction/ADduction: AAROM;Left;10 reps;Supine Straight Leg Raises: AAROM;Left;10 reps;Supine Goniometric ROM: 5-40* AAROM L knee    General Comments        Pertinent Vitals/Pain Pain Score: 8  Pain Location: L knee with activity Pain Descriptors / Indicators: Sharp;Sore Pain Intervention(s): RN gave pain meds during session;Monitored during session;Limited activity within patient's tolerance;Ice applied    Home Living                      Prior Function            PT Goals (current goals can now be found in the care plan section) Acute Rehab PT Goals Patient Stated Goal: to hike [place] the mountains PT Goal Formulation: With patient Time For Goal Achievement: 08/06/16 Potential to Achieve Goals: Good Progress towards PT goals: Progressing toward goals    Frequency  7X/week    PT Plan Current plan remains  appropriate    Co-evaluation             End of Session Equipment Utilized During Treatment: Gait belt Activity Tolerance: Patient limited by pain Patient left: in chair;with call bell/phone within reach     Time: 0903-0939 PT Time Calculation (min) (ACUTE ONLY): 36 min  Charges:  $Gait Training: 8-22 mins $Therapeutic Exercise: 8-22 mins                    G Codes:      Philomena Doheny 07/24/2016, 9:44  AM [phone]

## 2016-07-24 NOTE — Progress Notes (Signed)
Orthopedic Tech Progress Note Patient Details:  Jeff Edwards 1956/06/12 [id number]  Patient ID: Jeff Norris., male   DOB: 1955-12-26, 60 y.o.   MRN: [id number]   Jeff Edwards 07/24/2016, 1:55 PM Placed pt's lle on cpm @0 -50 degrees; will increase as pt tolerates; RN notified

## 2016-07-24 NOTE — Progress Notes (Signed)
Physical Therapy Treatment Patient Details Name: Jeff Edwards. MRN: [id number] DOB: 05/19/1956 Today's Date: 07/24/2016    History of Present Illness 60 y.o. male admitted for L TKA. PMH of B knee arthroscopies, ADD, DM, neuropathy    PT Comments    Pt progressing well with mobility, he ambulated 53' with RW, performed TKA exercises with min A.   Follow Up Recommendations  Home health PT     Equipment Recommendations  Rolling walker with 5" wheels;3in1 (PT);Other (comment) (transfer tub bench)    Recommendations for Other Services OT consult     Precautions / Restrictions Precautions Precautions: Knee;Fall Precaution Comments: 2 falls in past year  Required Braces or Orthoses: Knee Immobilizer - Left Knee Immobilizer - Left: Discontinue once straight leg raise with < 10 degree lag Restrictions Weight Bearing Restrictions: No LLE Weight Bearing: Weight bearing as tolerated    Mobility  Bed Mobility Overal bed mobility: Needs Assistance Bed Mobility: Supine to Sit;Sit to Supine     Supine to sit: Min assist Sit to supine: Supervision   General bed mobility comments: instructed pt to self assist LLE with RLE, min A for LLE, used bedrail  Transfers Overall transfer level: Needs assistance Equipment used: Rolling walker (2 wheeled) Transfers: Sit to/from Stand Sit to Stand: From elevated surface;Min guard         General transfer comment: VCs hand placement  Ambulation/Gait Ambulation/Gait assistance: Min guard Ambulation Distance (Feet): 80 Feet Assistive device: Rolling walker (2 wheeled) Gait Pattern/deviations: Step-to pattern   Gait velocity interpretation: Below normal speed for age/gender General Gait Details: VCs for sequencing,, steady with no LOB   Stairs            Wheelchair Mobility    Modified Rankin (Stroke Patients Only)       Balance     Sitting balance-Leahy Scale: Good       Standing balance-Leahy Scale: Poor                       Cognition Arousal/Alertness: Awake/alert Behavior During Therapy: WFL for tasks assessed/performed Overall Cognitive Status: Within Functional Limits for tasks assessed                      Exercises Total Joint Exercises Ankle Circles/Pumps: AROM;Both;10 reps Quad Sets: AROM;Both;Supine;10 reps Short Arc QuadSinclair Ship;Left;10 reps;Supine Heel Slides: AAROM;10 reps;Left;Supine Hip ABduction/ADduction: AAROM;Left;10 reps;Supine Straight Leg Raises: AAROM;Left;10 reps;Supine Goniometric ROM: 5-40* AAROM L knee    General Comments        Pertinent Vitals/Pain Pain Score: 7  Pain Location: L knee with walking Pain Descriptors / Indicators: Sore Pain Intervention(s): Limited activity within patient's tolerance;Monitored during session;Premedicated before session;Ice applied    Home Living                      Prior Function            PT Goals (current goals can now be found in the care plan section) Acute Rehab PT Goals Patient Stated Goal: to hike in the mountains PT Goal Formulation: With patient Time For Goal Achievement: 08/06/16 Potential to Achieve Goals: Good Progress towards PT goals: Progressing toward goals    Frequency  7X/week    PT Plan Current plan remains appropriate    Co-evaluation             End of Session Equipment Utilized During Treatment: Gait belt Activity Tolerance: Patient limited by pain  Patient left: with call bell/phone within reach;in bed     Time: 1243-1307 PT Time Calculation (min) (ACUTE ONLY): 24 min  Charges:  $Gait Training: 8-22 mins $Therapeutic Exercise: 8-22 mins                    G Codes:      Blondell Reveal Kistler 07/24/2016, 1:11 PM [phone]

## 2016-07-24 NOTE — Progress Notes (Signed)
Orthopedic Tech Progress Note Patient Details:  Jeff Edwards 1956-09-24 [id number]  Patient ID: [name], male   DOB: [birth year], 60 y.o.   MRN: [id number] Offered to put pt in cpm. Pt stated he had already been in during the night.  Karolee Stamps 07/24/2016, 6:05 AM

## 2016-07-25 LAB — GLUCOSE, CAPILLARY
GLUCOSE-CAPILLARY: 158 mg/dL — AB (ref 65–99)
GLUCOSE-CAPILLARY: 129 mg/dL — AB (ref 65–99)
Glucose-Capillary: 200 mg/dL — ABNORMAL HIGH (ref 65–99)
Glucose-Capillary: 147 mg/dL — ABNORMAL HIGH (ref 65–99)

## 2016-07-25 MED ORDER — ASPIRIN 325 MG PO TBEC: 325.0000 mg | DELAYED_RELEASE_TABLET | Freq: Two times a day (BID) | ORAL | 0 refills | Status: AC

## 2016-07-25 MED ORDER — METHOCARBAMOL 500 MG PO TABS: 500.0000 mg | ORAL_TABLET | Freq: Four times a day (QID) | ORAL | 0 refills | Status: DC | PRN

## 2016-07-25 MED ORDER — OXYCODONE-ACETAMINOPHEN 5-325 MG PO TABS: 1.0000 | ORAL_TABLET | ORAL | 0 refills | Status: DC | PRN

## 2016-07-25 NOTE — Care Management Note (Signed)
Case Management Note  Patient Details  Name: Jeff Edwards. MRN: [id number] Date of Birth: 06-22-1956  Subjective/Objective:    60 yr old gentleman s/p left total knee arthroplasty.                Action/Plan: Case manager spoke with patient concerning [place] and DME needs. Choice was offered for [facility]. Patient states he was preoperativel setup with [facility]. Patient has requested to purchase a tub bench from [employer], CM has requested. He will have family support at discharge. RW, 3in1 have been delivered to patient's room, CPM has been delivered to his home.     Expected Discharge Date:   07/25/16               Expected Discharge Plan:  [place]  In-House Referral:     Discharge planning Services  CM Consult  Post Acute Care Choice:  Home Health, Durable Medical Equipment Choice offered to:  Patient  DME Arranged:  Tub bench, 3-N-1, Walker rolling, CPM DME Agency:  [facility] [employer]  HH Arranged:  PT HH Agency:  [place]  Status of Service:  Completed, signed off  If discussed at [place] of Stay Meetings, dates discussed:    Additional Comments:  Ninfa Meeker, RN 07/25/2016, 11:10 AM

## 2016-07-25 NOTE — Progress Notes (Signed)
Physical Therapy Treatment Patient Details Name: Jeff Edwards. MRN: [id number] DOB: [birth year] Today's Date: 07/25/2016    History of Present Illness 60 y.o. male admitted for L TKA. PMH of B knee arthroscopies, ADD, DM, neuropathy    PT Comments    Pt performed increased mobility and progressed to stair training in prep for d/c home.  Will f/u in am to address needs to prepare patient for d/c.    Follow Up Recommendations  Home health PT     Equipment Recommendations  Rolling walker with 5" wheels;3in1 (PT);Other (comment) (tub transfer bench.  )    Recommendations for Other Services       Precautions / Restrictions Precautions Precautions: Knee;Fall Precaution Comments: 2 falls in past year  Required Braces or Orthoses: Knee Immobilizer - Left Knee Immobilizer - Left: Discontinue once straight leg raise with < 10 degree lag Restrictions Weight Bearing Restrictions: Yes LLE Weight Bearing: Weight bearing as tolerated    Mobility  Bed Mobility Overal bed mobility: Needs Assistance Bed Mobility: Supine to Sit     Supine to sit: Supervision Sit to supine: Supervision   General bed mobility comments: Attempted with bed flat and no rail.  Pt required increased time to complete transfer.    Transfers Overall transfer level: Needs assistance Equipment used: Rolling walker (2 wheeled) Transfers: Sit to/from Stand Sit to Stand: Supervision         General transfer comment: VCs for hand placement with multiple attempts for sit to stand.    Ambulation/Gait Ambulation/Gait assistance: Supervision Ambulation Distance (Feet): 140 Feet Assistive device: Rolling walker (2 wheeled) Gait Pattern/deviations: Step-through pattern   Gait velocity interpretation: Below normal speed for age/gender General Gait Details: Cues for sequencing and upper trunk control.  Pt with slight buckle, emphasis placed on L knee extension and L heel strike.     Stairs Stairs: Yes Stairs  assistance: Min assist Stair Management: No rails;Backwards;Forwards;Step to pattern;With walker Number of Stairs: 3 General stair comments: Cues for sequencing and RW placement.  Pt performed backward to ascend and forward to descend.  Will re-educate wife when present or provide hand outs.    Wheelchair Mobility    Modified Rankin (Stroke Patients Only)       Balance     Sitting balance-Leahy Scale: Good       Standing balance-Leahy Scale: Fair                      Cognition Arousal/Alertness: Awake/alert Behavior During Therapy: WFL for tasks assessed/performed Overall Cognitive Status: Within Functional Limits for tasks assessed                      Exercises Total Joint Exercises Ankle Circles/Pumps: AROM;Left;10 reps;Supine Quad Sets: AROM;Both;Supine;10 reps Short Arc QuadSinclair Ship;Left;10 reps;Supine Heel Slides: AAROM;10 reps;Left;Supine Hip ABduction/ADduction: AAROM;Left;10 reps;Supine Goniometric ROM: 66 degrees AAROM.  L knee.      General Comments        Pertinent Vitals/Pain Pain Assessment: 0-10 Pain Score: 4  Pain Location: L knee Pain Descriptors / Indicators: Grimacing;Guarding;Sore Pain Intervention(s): Limited activity within patient's tolerance;Repositioned;Ice applied    Home Living                      Prior Function            PT Goals (current goals can now be found in the care plan section) Acute Rehab PT Goals Patient Stated Goal: [place] Potential to Achieve Goals: Good Progress towards PT goals: Progressing toward goals    Frequency  7X/week    PT Plan Current plan remains appropriate    Co-evaluation             End of Session Equipment Utilized During Treatment: Gait belt Activity Tolerance: Patient limited by pain Patient left: with call bell/phone within reach;in bed     Time: [id number] PT Time Calculation (min) (ACUTE ONLY): 31 min  Charges:  $Gait Training:  8-22 mins $Therapeutic Exercise: 8-22 mins                    G Codes:      [name] 08-09-16, 4:56 PM  [name], PTA pager [phone]

## 2016-07-25 NOTE — Progress Notes (Signed)
Orthopedic Tech Progress Note Patient Details:  Jeff Edwards 09/04/1956 [id number]  Patient ID: Jeff Norris., male   DOB: [birth year], 60 y.o.   MRN: [id number] Applied cpm 0-50  Karolee Stamps 07/25/2016, 5:59 AM

## 2016-07-25 NOTE — Progress Notes (Signed)
Subjective: 2 Days Post-Op Procedure(s) (LRB): LEFT TOTAL KNEE ARTHROPLASTY (Left)  Activity level:  wbat Diet tolerance:  ok Voiding:  ok Patient reports pain as mild.    Objective: Vital signs in last 24 hours: Temp:  [98.4 F (36.9 C)-99.1 F (37.3 C)] 99.1 F (37.3 C) (09/07 1416) Pulse Rate:  [105-113] 105 (09/07 1416) Resp:  [18] 18 (09/07 1416) BP: (102-138)/(61-78) 102/61 (09/07 1416) SpO2:  [92 %-97 %] 92 % (09/07 1416)  Labs: No results for input(s): HGB in the last 72 hours. No results for input(s): WBC, RBC, HCT, PLT in the last 72 hours. No results for input(s): NA, K, CL, CO2, BUN, CREATININE, GLUCOSE, CALCIUM in the last 72 hours. No results for input(s): LABPT, INR in the last 72 hours.  Physical Exam:  Neurologically intact ABD soft Neurovascular intact Sensation intact distally Intact pulses distally Dorsiflexion/Plantar flexion intact Incision: dressing C/D/I and no drainage No cellulitis present Compartment soft  Assessment/Plan:  2 Days Post-Op Procedure(s) (LRB): LEFT TOTAL KNEE ARTHROPLASTY (Left) Advance diet Up with therapy D/C IV fluids Plan for discharge tomorrow Discharge home with home health if doing well and cleared by PT. Follow up in office 2 weeks post op. Continue on ASA 325mg  BID x 2 weeks post op. I changed dressing today.  Cipriana Biller, Larwance Sachs 07/25/2016, 3:48 PM

## 2016-07-25 NOTE — Consult Note (Signed)
   [facility] CM Inpatient Consult   07/25/2016  Jeff Edwards. May 18, 1956 [id number]   Spoke with [name] at bedside on behalf of [facility] Care Management program for [employer] employees/dependents with [facility] insurance. [name] endorses that he has DM. States he was recently followed by Norm Parcel to [employer] for [employer]. He states his DM was under control before. However, he states it is not as under control as he would like it to be. States " I know what to do about it though". Discussed re-enrolling in the LTW program. He states he did not think he was eligible because he does not work at [employer] any longer. States his insurance should run thru the end of the year. Made him aware that he can still receive follow up for DM management. States he is not sure if he wants to re-enroll in LTW at this time but will think about it. However, he is agreeable to post discharge call. Confirmed best contact number is [phone]. Appreciative of visit. Link to [employer] and contact information left at bedside.  Marthenia Rolling, MSN-Ed, RN,BSN [facility] Liaison [phone]

## 2016-07-25 NOTE — Progress Notes (Signed)
Physical Therapy Treatment Patient Details Name: Jeff Edwards. MRN: [id number] DOB: 1956-04-28 Today's Date: 07/25/2016    History of Present Illness 60 y.o. male admitted for L TKA. PMH of B knee arthroscopies, ADD, DM, neuropathy    PT Comments    Pt performed increased gait and reviewed therapeutic exercise.  Pt will be seen this afternoon to address stair training.    Follow Up Recommendations        Equipment Recommendations       Recommendations for Other Services       Precautions / Restrictions Precautions Precautions: Knee;Fall Precaution Comments: 2 falls in past year  Required Braces or Orthoses: Knee Immobilizer - Left Knee Immobilizer - Left: Discontinue once straight leg raise with < 10 degree lag Restrictions Weight Bearing Restrictions: Yes LLE Weight Bearing: Weight bearing as tolerated    Mobility  Bed Mobility Overal bed mobility: Needs Assistance Bed Mobility: Supine to Sit     Supine to sit: Supervision     General bed mobility comments: Use of bed rail with increased time.  Pt used RLE to advance operative limb.    Transfers Overall transfer level: Needs assistance Equipment used: Rolling walker (2 wheeled) Transfers: Sit to/from Stand Sit to Stand: From elevated surface;Min guard         General transfer comment: VCs hand placement  Ambulation/Gait Ambulation/Gait assistance: Min guard Ambulation Distance (Feet): 100 Feet Assistive device: Rolling walker (2 wheeled) Gait Pattern/deviations: Step-to pattern;Step-through pattern;Trunk flexed;Shuffle;Decreased stride length   Gait velocity interpretation: Below normal speed for age/gender General Gait Details: Cues for sequencing and upper trunk control.  Pt reports feeling mildly insteady.     Stairs            Wheelchair Mobility    Modified Rankin (Stroke Patients Only)       Balance Overall balance assessment: Needs assistance   Sitting balance-Leahy Scale:  Good       Standing balance-Leahy Scale: Poor                      Cognition Arousal/Alertness: Awake/alert Behavior During Therapy: WFL for tasks assessed/performed Overall Cognitive Status: Within Functional Limits for tasks assessed                      Exercises Total Joint Exercises Ankle Circles/Pumps: AROM;Left;10 reps;Supine Quad Sets: AROM;Both;Supine;10 reps Short Arc QuadSinclair Ship;Left;10 reps;Supine Heel Slides: AAROM;10 reps;Left;Supine Hip ABduction/ADduction: AAROM;Left;10 reps;Supine Straight Leg Raises: AAROM;Left;10 reps;Supine    General Comments        Pertinent Vitals/Pain Pain Assessment: 0-10 Pain Score: 6  Pain Location: L knee Pain Descriptors / Indicators: Sore;Grimacing;Guarding Pain Intervention(s): Monitored during session;Repositioned;Ice applied    Home Living                      Prior Function            PT Goals (current goals can now be found in the care plan section) Acute Rehab PT Goals Patient Stated Goal: to hike [place] the mountains Potential to Achieve Goals: Good Progress towards PT goals: Progressing toward goals    Frequency       PT Plan      Co-evaluation             End of Session Equipment Utilized During Treatment: Gait belt Activity Tolerance: Patient limited by pain Patient left: with call bell/phone within reach;in bed     Time: [id number]  PT Time Calculation (min) (ACUTE ONLY): 31 min  Charges:  $Gait Training: 8-22 mins $Therapeutic Exercise: 8-22 mins                    G Codes:      [name] 29-Jul-2016, 10:46 AM [name], PTA pager [phone]

## 2016-07-25 NOTE — Progress Notes (Signed)
Orthopedic Tech Progress Note Patient Details:  Jeff Edwards November 06, 1956 [id number]  Patient ID: [name], male   DOB: 1955/12/28, 60 y.o.   MRN: [id number]   [name] 07/25/2016, 2:11 PM Placed pt's lle on cpm @0 -60 degrees @1400 ; RN notified

## 2016-07-25 NOTE — Progress Notes (Addendum)
Occupational Therapy Treatment Patient Details Name: Jeff Edwards. MRN: [id number] DOB: Mar 19, 1956 Today's Date: 07/25/2016    History of present illness 60 y.o. male admitted for L TKA. PMH of B knee arthroscopies, ADD, DM, neuropathy   OT comments  Pt making good progress with functional goals. Pt instructed on A/E, tub bench and 3 in 1 use for home.   Follow Up Recommendations  Supervision/Assistance - 24 hour;Home health OT    Equipment Recommendations  3 in 1 bedside comode;Tub/shower bench;Other (comment) (ADL A/E)    Recommendations for Other Services      Precautions / Restrictions Precautions Precautions: Knee;Fall Precaution Comments: 2 falls in past year  Required Braces or Orthoses: Knee Immobilizer - Left Knee Immobilizer - Left: Discontinue once straight leg raise with < 10 degree lag Restrictions Weight Bearing Restrictions: Yes LLE Weight Bearing: Weight bearing as tolerated       Mobility Bed Mobility Overal bed mobility: Needs Assistance Bed Mobility: Sit to Supine     Supine to sit: Supervision Sit to supine: Supervision   General bed mobility comments: Use of bed rail with increased time.  Pt used RLE to advance operative limb.    Transfers Overall transfer level: Needs assistance Equipment used: Rolling walker (2 wheeled) Transfers: Sit to/from Stand Sit to Stand: From elevated surface;Min guard         General transfer comment: VCs hand placement    Balance Overall balance assessment: Needs assistance   Sitting balance-Leahy Scale: Good       Standing balance-Leahy Scale: Poor                     ADL Overall ADL's : Needs assistance/impaired     Grooming: Wash/dry hands;Wash/dry face;Standing       Lower Body Bathing: Moderate assistance Lower Body Bathing Details (indicate cue type and reason): simulated, A/E instruction     Lower Body Dressing: Moderate assistance Lower Body Dressing Details (indicate cue  type and reason): simulated with A/E instruction Toilet Transfer: Min guard;RW;Ambulation;Comfort height toilet   Toileting- Clothing Manipulation and Hygiene: Minimal assistance;Sit to/from stand   Tub/ Shower Transfer: Tub bench;3 in 1;Min guard   Functional mobility during ADLs: Min guard General ADL Comments: pt provided with A/E and DME instruction for ADLs. Plans to get tub bench and has 3 in 1 and RW already delivered to room      Vision  wears glasses, no change from baseline                              Cognition   Behavior During Therapy: [facility] for tasks assessed/performed Overall Cognitive Status: Within Functional Limits for tasks assessed                       Extremity/Trunk Assessment   WFL                       General Comments  pt pleasant and cooperative    Pertinent Vitals/ Pain       Pain Assessment: 0-10 Pain Score: 4  Pain Location: L knee Pain Descriptors / Indicators: Grimacing;Guarding;Sore Pain Intervention(s): Monitored during session;Ice applied  Home Living  lives at home with wife  Prior Functioning/Environment  indpendent            Frequency Min 2X/week     Progress Toward Goals  OT Goals(current goals can now be found in the care plan section)  Progress towards OT goals: Progressing toward goals  Acute Rehab OT Goals Patient Stated Goal: to hike in the [place] Discharge plan remains appropriate                     End of Session Equipment Utilized During Treatment: Gait belt;Rolling walker;Other (comment) (3 in 1, tub bench, A/E) CPM Left Knee Additional Comments: bone foam applied   Activity Tolerance Patient tolerated treatment well   Patient Left in bed;with call bell/phone within reach   Nurse Communication          Time: [id number] OT Time Calculation (min): 25 min  Charges: OT General Charges $OT Visit: 1  Procedure OT Treatments $Self Care/Home Management : 8-22 mins $Therapeutic Activity: 8-22 mins  Britt Bottom 07/25/2016, 1:20 PM

## 2016-07-26 LAB — GLUCOSE, CAPILLARY
Glucose-Capillary: 122 mg/dL — ABNORMAL HIGH (ref 65–99)
Glucose-Capillary: 197 mg/dL — ABNORMAL HIGH (ref 65–99)

## 2016-07-26 MED FILL — METHOCARBAMOL 500 MG TABLET: 500 | 13 days supply | Qty: 50 | Fill #0

## 2016-07-26 MED FILL — ASPIRIN EC 325 MG TABLET: 325 | 15 days supply | Qty: 30 | Fill #0

## 2016-07-26 MED FILL — OXYCODONE/APAP 5-325: 5-325 | 8 days supply | Qty: 50 | Fill #0

## 2016-07-26 MED FILL — CELECOXIB 200 MG CAPSULE: 200 | 30 days supply | Qty: 30 | Fill #0

## 2016-07-26 NOTE — Progress Notes (Signed)
Physical Therapy Treatment Patient Details Name: Jeff Edwards. MRN: [id number] DOB: January 16, 1956 Today's Date: 07/26/2016    History of Present Illness 60 y.o. male admitted for L TKA. PMH of B knee arthroscopies, ADD, DM, neuropathy    PT Comments    Pt performed increased gait.  PTA issued HEP for therapeutic exercise and stair training.  Pt ready to d/c from a mobility standpoint.    Follow Up Recommendations  Home health PT     Equipment Recommendations  Rolling walker with 5" wheels;3in1 (PT);Other (comment) (tub transfer bench)    Recommendations for Other Services       Precautions / Restrictions Precautions Precautions: Knee;Fall Precaution Comments: 2 falls in past year  Required Braces or Orthoses: Knee Immobilizer - Left (d/c'd) Restrictions Weight Bearing Restrictions: Yes LLE Weight Bearing: Weight bearing as tolerated    Mobility  Bed Mobility Overal bed mobility: Needs Assistance Bed Mobility: Supine to Sit     Supine to sit: Modified independent (Device/Increase time)     General bed mobility comments: Increased time with bed flat and no rails.    Transfers Overall transfer level: Needs assistance Equipment used: Rolling walker (2 wheeled) Transfers: Sit to/from Stand Sit to Stand: Modified independent (Device/Increase time)         General transfer comment: good technique.  Good eccentric loading.    Ambulation/Gait Ambulation/Gait assistance: Supervision Ambulation Distance (Feet): 185 Feet Assistive device: Rolling walker (2 wheeled) Gait Pattern/deviations: Step-to pattern;Step-through pattern;Trunk flexed;Decreased stride length;Antalgic   Gait velocity interpretation: Below normal speed for age/gender General Gait Details: Cues for sequencing and upper trunk control.  Emphasis placed on L knee extension and L heel strike.     Stairs Stairs:  (verbally reviewed technique to ensure safety and issued handout as wife not present  during session.  )          Wheelchair Mobility    Modified Rankin (Stroke Patients Only)       Balance     Sitting balance-Leahy Scale: Good       Standing balance-Leahy Scale: Fair                      Cognition Arousal/Alertness: Awake/alert Behavior During Therapy: WFL for tasks assessed/performed Overall Cognitive Status: Within Functional Limits for tasks assessed                      Exercises Total Joint Exercises Ankle Circles/Pumps: AROM;Left;10 reps;Supine Quad Sets: AROM;Both;Supine;10 reps Short Arc QuadSinclair Edwards;Left;10 reps;Supine Heel Slides: AAROM;10 reps;Left;Supine Hip ABduction/ADduction: AAROM;Left;10 reps;Supine Straight Leg Raises: AAROM;Left;10 reps;Supine Long Arc Quad: AAROM;Left;Seated;10 reps Knee Flexion: AROM;5 reps;Left;AAROM Goniometric ROM: 73 degrees flexion L knee.      General Comments        Pertinent Vitals/Pain Pain Assessment: 0-10 Pain Score: 6  Pain Location: L knee Pain Descriptors / Indicators: Grimacing;Guarding;Sore Pain Intervention(s): Limited activity within patient's tolerance;Repositioned;Ice applied    Home Living                      Prior Function            PT Goals (current goals can now be found in the care plan section) Acute Rehab PT Goals Patient Stated Goal: to [place] the mountains Potential to Achieve Goals: Good Progress towards PT goals: Progressing toward goals    Frequency  7X/week    PT Plan Current plan remains appropriate    Co-evaluation  End of Session Equipment Utilized During Treatment: Gait belt Activity Tolerance: Patient limited by pain Patient left: with call bell/phone within reach;in bed     Time: 0941-1008 PT Time Calculation (min) (ACUTE ONLY): 27 min  Charges:  $Gait Training: 8-22 mins $Therapeutic Exercise: 8-22 mins                    G Codes:      [name] August 02, 2016, 11:33 AM  [name], PTA  pager [phone]

## 2016-07-26 NOTE — Discharge Summary (Signed)
Patient ID: Jeff Edwards. MRN: [id number] DOB/AGE: January 09, 1956 60 y.o.  Admit date: 07/23/2016 Discharge date: 07/26/2016  Admission Diagnoses:  Principal Problem:   Primary osteoarthritis of left knee Active Problems:   Diabetes [facility])   Discharge Diagnoses:  Same  Past Medical History:  Diagnosis Date  . ADHD (attention deficit hyperactivity disorder)   . Anemia    as a child  . 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 Basal cell carcinoma of left forearm   . Diverticulitis   . GERD (gastroesophageal reflux disease)   . High cholesterol   . Hypertension   . Memory change   . Neuropathy ([place])    in feet  . Osteoarthritis of both knees   . Retinal detachment    right eye; "no OR" (07/23/2016)  . Sleep apnea    cpap does not use (study done 10+ yrs) - has lost weight  . Type II diabetes mellitus ([place])     Surgeries: Procedure(s): LEFT TOTAL KNEE ARTHROPLASTY on 07/23/2016   Consultants:   Discharged Condition: Improved  Hospital Course: Jeff Edwards. is an 60 y.o. male who was admitted 07/23/2016 for operative treatment ofPrimary osteoarthritis of left knee. Patient has severe unremitting pain that affects sleep, daily activities, and work/hobbies. After pre-op clearance the patient was taken to the operating room on 07/23/2016 and underwent  Procedure(s): LEFT TOTAL KNEE ARTHROPLASTY.    Patient was given perioperative antibiotics: Anti-infectives    Start     Dose/Rate Route Frequency Ordered Stop   07/23/16 1930  vancomycin (VANCOCIN) 1,500 mg in sodium chloride 0.9 % 500 mL IVPB     1,500 mg 250 mL/hr over 120 Minutes Intravenous Every 12 hours 07/23/16 1228 07/23/16 2057   07/23/16 0630  vancomycin (VANCOCIN) 1,500 mg in sodium chloride 0.9 % 500 mL IVPB     1,500 mg 250 mL/hr over 120 Minutes Intravenous To [facility]Stay [facility] 07/19/16 1304 07/23/16 0830       Patient was given sequential compression devices, early ambulation, and  chemoprophylaxis to prevent DVT.  Patient benefited maximally from hospital stay and there were no complications.    Recent vital signs: Patient Vitals for the past 24 hrs:  BP Temp Temp src Pulse Resp SpO2  07/26/16 0433 120/70 98.8 F (37.1 C) Oral 99 18 97 %  07/25/16 2020 117/75 98.7 F (37.1 C) Oral (!) 105 18 95 %  07/25/16 1416 102/61 99.1 F (37.3 C) Oral (!) 105 18 92 %     Recent laboratory studies: No results for input(s): WBC, HGB, HCT, PLT, NA, K, CL, CO2, BUN, CREATININE, GLUCOSE, INR, CALCIUM in the last 72 hours.  Invalid input(s): PT, 2   Discharge Medications:     Medication List    STOP taking these medications   celecoxib 200 MG capsule Commonly known as:  CELEBREX   HYDROcodone-acetaminophen 5-325 MG tablet Commonly known as:  NORCO/VICODIN     TAKE these medications   aspirin 325 MG EC tablet Take 1 tablet (325 mg total) by mouth 2 (two) times daily after a meal. What changed:  medication strength  how much to take  when to take this   atorvastatin 20 MG tablet Commonly known as:  LIPITOR Take 20 mg by mouth every evening.   cetirizine 10 MG tablet Commonly known as:  ZYRTEC Take 10 mg by mouth daily.   Flax Seed Oil 1000 MG Caps Take 1,000 mg by mouth daily.   glimepiride 1 MG  tablet Commonly known as:  AMARYL Take 2 mg by mouth daily with breakfast.   lisinopril 5 MG tablet Commonly known as:  PRINIVIL,ZESTRIL Take 5 mg by mouth every evening.   Magnesium 250 MG Tabs Take 250 mg by mouth daily.   metFORMIN 500 MG tablet Commonly known as:  GLUCOPHAGE Take 1,000 mg by mouth 2 (two) times daily with a meal.   methocarbamol 500 MG tablet Commonly known as:  ROBAXIN Take 1 tablet (500 mg total) by mouth every 6 (six) hours as needed for muscle spasms.   methylphenidate 10 MG tablet Commonly known as:  RITALIN Take 1 tablet (10 mg total) by mouth 2 (two) times daily.   multivitamin with minerals Tabs tablet Take 1 tablet  by mouth daily.   niacin 500 MG tablet Commonly known as:  SLO-NIACIN Take 500 mg by mouth at bedtime.   oxyCODONE-acetaminophen 5-325 MG tablet Commonly known as:  ROXICET Take 1-2 tablets by mouth every 4 (four) hours as needed for severe pain. What changed:  how much to take   pantoprazole 40 MG tablet Commonly known as:  PROTONIX Take 40 mg by mouth daily.   SUPER B COMPLEX PO Take 1 tablet by mouth daily.       Diagnostic Studies: Dg Chest 2 View  Result Date: 07/11/2016 CLINICAL DATA:  Preoperative evaluation. History of hypertension, diabetes, former smoker EXAM: CHEST  2 VIEW COMPARISON:  PA and lateral chest x-ray of June 29, 2012 FINDINGS: The lungs are adequately inflated. There is no focal infiltrate. There is no pleural effusion. The heart and pulmonary vascularity are normal. The mediastinum is normal in width. There is calcification in the wall of the aortic arch. There is mild multilevel degenerative disc disease of the thoracic spine. IMPRESSION: There is no active cardiopulmonary disease. Aortic atherosclerosis. Electronically Signed   By: David  Martinique M.D.   On: 07/11/2016 09:00    Disposition: 01-Home or Self Care  Discharge Instructions    AMB Referral to [place] Management    Complete by:  As directed   Please assign UMR member for post toc. Thanks. Marthenia Rolling, [place], [facility] [phone]   Reason for consult:  Please assign UMR member for post toc   Diagnoses of:  Diabetes   Expected date of contact:  1-3 days (reserved for hospital discharges)   Call MD / Call 911    Complete by:  As directed   If you experience chest pain or shortness of breath, CALL 911 and be transported to the hospital emergency room.  If you develope a fever above 101 F, pus (white drainage) or increased drainage or redness at the wound, or calf pain, call your surgeon's office.   Constipation Prevention    Complete by:  As directed   Drink  plenty of fluids.  Prune juice may be helpful.  You may use a stool softener, such as Colace (over the counter) 100 mg twice a day.  Use MiraLax (over the counter) for constipation as needed.   Diet - low sodium heart healthy    Complete by:  As directed   Discharge instructions    Complete by:  As directed   INSTRUCTIONS AFTER JOINT REPLACEMENT   Remove items at home which could result in a fall. This includes throw rugs or furniture in walking pathways ICE to the affected joint every three hours while awake for 30 minutes at a time, for at least the first 3-5 days, and  then as needed for pain and swelling.  Continue to use ice for pain and swelling. You may notice swelling that will progress down to the foot and ankle.  This is normal after surgery.  Elevate your leg when you are not up walking on it.   Continue to use the breathing machine you got in the hospital (incentive spirometer) which will help keep your temperature down.  It is common for your temperature to cycle up and down following surgery, especially at night when you are not up moving around and exerting yourself.  The breathing machine keeps your lungs expanded and your temperature down.   DIET:  As you were doing prior to hospitalization, we recommend a well-balanced diet.  DRESSING / WOUND CARE / SHOWERING  You may shower 3 days after surgery, but keep the wounds dry during showering.  You may use an occlusive plastic wrap (Press'n Seal for example), NO SOAKING/SUBMERGING IN THE BATHTUB.  If the bandage gets wet, change with a clean dry gauze.  If the incision gets wet, pat the wound dry with a clean towel.  ACTIVITY  Increase activity slowly as tolerated, but follow the weight bearing instructions below.   No driving for 6 weeks or until further direction given by your physician.  You cannot drive while taking narcotics.  No lifting or carrying greater than 10 lbs. until further directed by your surgeon. Avoid periods of  inactivity such as sitting longer than an hour when not asleep. This helps prevent blood clots.  You may return to work once you are authorized by your doctor.     WEIGHT BEARING   Weight bearing as tolerated with assist device (walker, cane, etc) as directed, use it as long as suggested by your surgeon or therapist, typically at least 4-6 weeks.   EXERCISES  Results after joint replacement surgery are often greatly improved when you follow the exercise, range of motion and muscle strengthening exercises prescribed by your doctor. Safety measures are also important to protect the joint from further injury. Any time any of these exercises cause you to have increased pain or swelling, decrease what you are doing until you are comfortable again and then slowly increase them. If you have problems or questions, call your caregiver or physical therapist for advice.   Rehabilitation is important following a joint replacement. After just a few days of immobilization, the muscles of the leg can become weakened and shrink (atrophy).  These exercises are designed to build up the tone and strength of the thigh and leg muscles and to improve motion. Often times heat used for twenty to thirty minutes before working out will loosen up your tissues and help with improving the range of motion but do not use heat for the first two weeks following surgery (sometimes heat can increase post-operative swelling).   These exercises can be done on a training (exercise) mat, on the floor, on a table or on a bed. Use whatever works the best and is most comfortable for you.    Use music or television while you are exercising so that the exercises are a pleasant break in your day. This will make your life better with the exercises acting as a break in your routine that you can look forward to.   Perform all exercises about fifteen times, three times per day or as directed.  You should exercise both the operative leg and the  other leg as well.   Exercises include:   [employer]  Sets - Tighten up the muscle on the front of the thigh (Quad) and hold for 5-10 seconds.   Straight Leg Raises - With your knee straight (if you were given a brace, keep it on), lift the leg to 60 degrees, hold for 3 seconds, and slowly lower the leg.  Perform this exercise against resistance later as your leg gets stronger.  Leg Slides: Lying on your back, slowly slide your foot toward your buttocks, bending your knee up off the floor (only go as far as is comfortable). Then slowly slide your foot back down until your leg is flat on the floor again.  Angel Wings: Lying on your back spread your legs to the side as far apart as you can without causing discomfort.  Hamstring Strength:  Lying on your back, push your heel against the floor with your leg straight by tightening up the muscles of your buttocks.  Repeat, but this time bend your knee to a comfortable angle, and push your heel against the floor.  You may put a pillow under the heel to make it more comfortable if necessary.   A rehabilitation program following joint replacement surgery can speed recovery and prevent re-injury in the future due to weakened muscles. Contact your doctor or a physical therapist for more information on knee rehabilitation.    CONSTIPATION  Constipation is defined medically as fewer than three stools per week and severe constipation as less than one stool per week.  Even if you have a regular bowel pattern at home, your normal regimen is likely to be disrupted due to multiple reasons following surgery.  Combination of anesthesia, postoperative narcotics, change in appetite and fluid intake all can affect your bowels.   YOU MUST use at least one of the following options; they are listed in order of increasing strength to get the job done.  They are all available over the counter, and you may need to use some, POSSIBLY even all of these options:    Drink plenty of  fluids (prune juice may be helpful) and high fiber foods Colace 100 mg by mouth twice a day  Senokot for constipation as directed and as needed Dulcolax (bisacodyl), take with full glass of water  Miralax (polyethylene glycol) once or twice a day as needed.  If you have tried all these things and are unable to have a bowel movement in the first 3-4 days after surgery call either your surgeon or your primary doctor.    If you experience loose stools or diarrhea, hold the medications until you stool forms back up.  If your symptoms do not get better within 1 week or if they get worse, check with your doctor.  If you experience "the worst abdominal pain ever" or develop nausea or vomiting, please contact the office immediately for further recommendations for treatment.   ITCHING:  If you experience itching with your medications, try taking only a single pain pill, or even half a pain pill at a time.  You can also use Benadryl over the counter for itching or also to help with sleep.   TED HOSE STOCKINGS:  Use stockings on both legs until for at least 2 weeks or as directed by physician office. They may be removed at night for sleeping.  MEDICATIONS:  See your medication summary on the "After Visit Summary" that nursing will review with you.  You may have some home medications which will be placed on hold until you complete the course of blood  thinner medication.  It is important for you to complete the blood thinner medication as prescribed.  PRECAUTIONS:  If you experience chest pain or shortness of breath - call 911 immediately for transfer to the hospital emergency department.   If you develop a fever greater that 101 F, purulent drainage from wound, increased redness or drainage from wound, foul odor from the wound/dressing, or calf pain - CONTACT YOUR SURGEON.                                                   FOLLOW-UP APPOINTMENTS:  If you do not already have a post-op appointment, please call  the office for an appointment to be seen by your surgeon.  Guidelines for how soon to be seen are listed in your "After Visit Summary", but are typically between 1-4 weeks after surgery.  OTHER INSTRUCTIONS:   Knee Replacement:  Do not place pillow under knee, focus on keeping the knee straight while resting. CPM instructions: 0-90 degrees, 2 hours in the morning, 2 hours in the afternoon, and 2 hours in the evening. Place foam block, curve side up under heel at all times except when in CPM or when walking.  DO NOT modify, tear, cut, or change the foam block in any way.  MAKE SURE YOU:  Understand these instructions.  Get help right away if you are not doing well or get worse.    Thank you for letting us be a part of your medical care team.  It is a privilege we respect greatly.  We hope these instructions will help you stay on track for a fast and full recovery!   Increase activity slowly as tolerated    Complete by:  As directed      Follow-up Information    DALLDORF,PETER G, MD. Schedule an appointment as soon as possible for a visit in 2 week(s).   Specialty:  Orthopedic Surgery Contact information: [place] [address] [place] .   Why:  Someone from [facility] will contact you to arange start date and time for therapy. Contact information: [address] [place] [address] [phone]            Signed: Rich Fuchs 07/26/2016, 7:33 AM

## 2016-07-26 NOTE — Progress Notes (Signed)
Reviewed discharge information/medications with patient.  Answered all of his questions.  Patient is ready for discharge.

## 2016-07-27 DIAGNOSIS — E1140 Type 2 diabetes mellitus with diabetic neuropathy, unspecified: Secondary | ICD-10-CM | POA: Diagnosis not present

## 2016-07-27 DIAGNOSIS — I10 Essential (primary) hypertension: Secondary | ICD-10-CM | POA: Diagnosis not present

## 2016-07-27 DIAGNOSIS — Z471 Aftercare following joint replacement surgery: Secondary | ICD-10-CM | POA: Diagnosis not present

## 2016-07-27 DIAGNOSIS — Z96652 Presence of left artificial knee joint: Secondary | ICD-10-CM | POA: Diagnosis not present

## 2016-07-29 ENCOUNTER — Other Ambulatory Visit: Payer: Self-pay | Admitting: *Deleted

## 2016-07-29 NOTE — Patient Outreach (Addendum)
[facility]) Care Management  07/29/2016  [name] 06-Jun-1956 [id number]  Subjective: Telephone call to patient's home/ mobile number, spoke with patient, and HIPAA verified.    Discussed [facility] Care Management UMR Transition of care follow up and patient voices understanding. Patient request call back on 07/30/16 to complete transition of care follow up.  States RNCM can call between 10:00am - 12pm and 3:00pm - 4:00pm on 07/30/16.    Objective:  Patient hospitalized 07/23/16 - 07/26/16 for Primary osteoarthritis of left knee and status post LEFT TOTAL KNEE ARTHROPLASTY  On 07/23/16.  Patient participated in [place] to Wellness program for diabetes on 04/05/16.    Patient has a history of diabetes and hypertension.   Assessment: Received UMR Transition of care referral on 07/26/16.   Telephone screen/ transition of care follow up, pending patient contact.     Plan: RNCM will call patient for 2nd telephonic outreach attempt,  telephone screen/ transition of care follow up, within 10 business days, if no return call.   Elza Varricchio H. Annia Friendly, BSN, [place] [facility] Telephonic CM Phone: [phone] Fax: [phone]

## 2016-07-30 ENCOUNTER — Ambulatory Visit: Payer: Self-pay | Admitting: *Deleted

## 2016-07-30 ENCOUNTER — Other Ambulatory Visit: Payer: Self-pay | Admitting: *Deleted

## 2016-07-30 DIAGNOSIS — I10 Essential (primary) hypertension: Secondary | ICD-10-CM | POA: Diagnosis not present

## 2016-07-30 DIAGNOSIS — Z471 Aftercare following joint replacement surgery: Secondary | ICD-10-CM | POA: Diagnosis not present

## 2016-07-30 DIAGNOSIS — E1140 Type 2 diabetes mellitus with diabetic neuropathy, unspecified: Secondary | ICD-10-CM | POA: Diagnosis not present

## 2016-07-30 NOTE — Patient Outreach (Addendum)
[facility]) Care Management  07/30/2016  Jeff Das Jr. [birth year] [id number]  Subjective: Telephone call from patient's home / mobile number, spoke with patient, and HIPAA verified.  States he is doing well, familiar with [place] Management services, Link to [place] is in agreement to transition of care follow up.  Patient states he received a hospital follow up call from primary MD's office on 07/29/16 and they stated that he did not need to schedule an appointment for hospital follow up.  States he has a follow up appointment with surgeon on 08/02/16.    He is receiving home health physical therapy through [facility].   States he is familiar with the Link to [employer] and participated until he went on long term disability.   States his wife is a Marine scientist and will assist him with diabetes management.  RNCM advised patient still eligible for Link to Wellness as long as he has [employer] benefits.  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  States he continues to utilize [employer], long term disability, [facility], [place] card, and currently pays out of pocket to maintain [facility] benefits.  States he will contact [facility] to resume Link to [employer], if he finds a new job within [employer] and maintains benefits in 2018.  Patient states he does not have any transition of care, care coordination, disease management, disease monitoring, transportation, community resource, or pharmacy needs at this time.  Patient declined additional [place] Management information ( successful outreach letter, [facility] pamphlet), states he already the Link to Wellness package and contact information.   Patient states he is very appreciative of the follow up call.    Objective: Patient hospitalized 07/23/16 - 07/26/16 for Primary osteoarthritis of left  knee and status post LEFT TOTAL KNEE ARTHROPLASTY On 07/23/16. Patient participated in [place] to Wellness program for diabetes on 04/05/16. Patient has a history of diabetes and hypertension.   Assessment:Received UMR Transition of care referral on 07/26/16. Telephone screen/ transition of care follow up completed, no care management needs, and will proceed with case closure.     Plan: RNCM will send case closure due to follow completed / no care management needs request to [facility] [place] [facility].  Marlyce Mcdougald H. Annia Friendly, BSN, [place] [facility] Telephonic CM Phone: [phone] Fax: [phone]

## 2016-07-30 NOTE — Patient Outreach (Signed)
[facility]) Care Management  07/30/2016  Jeff Das Jr. 06-17-1956 [id number]  Subjective: Telephone call to patient's home / mobile number, no answer, left HIPAA compliant voicemail message, and requested call back.   Objective:  Patient hospitalized 07/23/16 - 07/26/16 for Primary osteoarthritis of left knee and status post LEFT TOTAL KNEE ARTHROPLASTY  On 07/23/16.  Patient participated in [place] to Wellness program for diabetes on 04/05/16.    Patient has a history of diabetes and hypertension.   Assessment: Received UMR Transition of care referral on 07/26/16.   Telephone screen/ transition of care follow up, pending patient contact.     Plan: RNCM will call patient for 3rd telephonic outreach attempt, telephone screen/ transition of care follow up, within 10 business days, if no return call.   Crisol Muecke H. Annia Friendly, BSN, [place] Management [facility] Telephonic CM Phone: [phone] Fax: [phone]

## 2016-08-01 DIAGNOSIS — Z471 Aftercare following joint replacement surgery: Secondary | ICD-10-CM | POA: Diagnosis not present

## 2016-08-01 DIAGNOSIS — E1140 Type 2 diabetes mellitus with diabetic neuropathy, unspecified: Secondary | ICD-10-CM | POA: Diagnosis not present

## 2016-08-01 DIAGNOSIS — I10 Essential (primary) hypertension: Secondary | ICD-10-CM | POA: Diagnosis not present

## 2016-08-01 MED FILL — PANTOPRAZOLE SOD DR 40 MG T: 40 | 90 days supply | Qty: 90 | Fill #1

## 2016-08-02 DIAGNOSIS — Z471 Aftercare following joint replacement surgery: Secondary | ICD-10-CM | POA: Diagnosis not present

## 2016-08-02 DIAGNOSIS — M1712 Unilateral primary osteoarthritis, left knee: Secondary | ICD-10-CM | POA: Diagnosis not present

## 2016-08-02 DIAGNOSIS — Z96652 Presence of left artificial knee joint: Secondary | ICD-10-CM | POA: Diagnosis not present

## 2016-08-02 MED FILL — OXYCODONE-APAP 7.5-325 MG: 7.5-325 | 3 days supply | Qty: 40 | Fill #0

## 2016-08-06 DIAGNOSIS — Z471 Aftercare following joint replacement surgery: Secondary | ICD-10-CM | POA: Diagnosis not present

## 2016-08-06 DIAGNOSIS — E1140 Type 2 diabetes mellitus with diabetic neuropathy, unspecified: Secondary | ICD-10-CM | POA: Diagnosis not present

## 2016-08-06 DIAGNOSIS — I10 Essential (primary) hypertension: Secondary | ICD-10-CM | POA: Diagnosis not present

## 2016-08-08 DIAGNOSIS — Z471 Aftercare following joint replacement surgery: Secondary | ICD-10-CM | POA: Diagnosis not present

## 2016-08-08 DIAGNOSIS — I10 Essential (primary) hypertension: Secondary | ICD-10-CM | POA: Diagnosis not present

## 2016-08-08 DIAGNOSIS — E1140 Type 2 diabetes mellitus with diabetic neuropathy, unspecified: Secondary | ICD-10-CM | POA: Diagnosis not present

## 2016-08-09 MED FILL — OXYCODONE-APAP 7.5-325 MG: 7.5-325 | 3 days supply | Qty: 40 | Fill #0

## 2016-08-13 DIAGNOSIS — Z760 Encounter for issue of repeat prescription: Secondary | ICD-10-CM | POA: Diagnosis not present

## 2016-08-14 DIAGNOSIS — I10 Essential (primary) hypertension: Secondary | ICD-10-CM | POA: Diagnosis not present

## 2016-08-14 DIAGNOSIS — E1140 Type 2 diabetes mellitus with diabetic neuropathy, unspecified: Secondary | ICD-10-CM | POA: Diagnosis not present

## 2016-08-14 DIAGNOSIS — Z471 Aftercare following joint replacement surgery: Secondary | ICD-10-CM | POA: Diagnosis not present

## 2016-08-15 MED FILL — LISINOPRIL 5 MG TABLET: 5 | 90 days supply | Qty: 90 | Fill #1

## 2016-08-16 DIAGNOSIS — M1712 Unilateral primary osteoarthritis, left knee: Secondary | ICD-10-CM | POA: Diagnosis not present

## 2016-08-16 MED FILL — OXYCODONE-APAP 7.5-325 MG: 7.5-325 | 10 days supply | Qty: 40 | Fill #0

## 2016-08-16 MED FILL — METHOCARBAMOL 500 MG TABLET: 500 | 10 days supply | Qty: 40 | Fill #0

## 2016-08-21 DIAGNOSIS — Z471 Aftercare following joint replacement surgery: Secondary | ICD-10-CM | POA: Diagnosis not present

## 2016-08-21 DIAGNOSIS — I10 Essential (primary) hypertension: Secondary | ICD-10-CM | POA: Diagnosis not present

## 2016-08-21 DIAGNOSIS — E1140 Type 2 diabetes mellitus with diabetic neuropathy, unspecified: Secondary | ICD-10-CM | POA: Diagnosis not present

## 2016-08-22 ENCOUNTER — Encounter: Payer: Self-pay | Admitting: Physical Therapy

## 2016-08-22 ENCOUNTER — Ambulatory Visit: Payer: 59 | Attending: Orthopaedic Surgery | Admitting: Physical Therapy

## 2016-08-22 DIAGNOSIS — Z9889 Other specified postprocedural states: Secondary | ICD-10-CM | POA: Insufficient documentation

## 2016-08-22 DIAGNOSIS — M25662 Stiffness of left knee, not elsewhere classified: Secondary | ICD-10-CM | POA: Diagnosis not present

## 2016-08-22 DIAGNOSIS — R262 Difficulty in walking, not elsewhere classified: Secondary | ICD-10-CM | POA: Insufficient documentation

## 2016-08-22 DIAGNOSIS — M25661 Stiffness of right knee, not elsewhere classified: Secondary | ICD-10-CM | POA: Insufficient documentation

## 2016-08-22 DIAGNOSIS — M25562 Pain in left knee: Secondary | ICD-10-CM | POA: Diagnosis not present

## 2016-08-22 NOTE — Therapy (Signed)
[place], [place], [address] Phone: [phone]   Fax:  [phone]  Physical Therapy Evaluation  Patient Details  Name: Jeff Edwards. MRN: [id number] Date of Birth: Nov 28, 1955 Referring Provider: Shelly Coss, MD  Encounter Date: 08/22/2016      PT End of Session - 08/22/16 1037    Visit Number 1   Number of Visits 16   Date for PT Re-Evaluation 10/22/16   PT Start Time 1018   PT Stop Time 1100   PT Time Calculation (min) 42 min   Activity Tolerance Patient tolerated treatment well   Behavior During Therapy [facility] for tasks assessed/performed      Past Medical History:  Diagnosis Date  . ADHD (attention deficit hyperactivity disorder)   . Anemia    as a child  . 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 Basal cell carcinoma of left forearm   . Diverticulitis   . GERD (gastroesophageal reflux disease)   . High cholesterol   . Hypertension   . Memory change   . Neuropathy ([place])    in feet  . Osteoarthritis of both knees   . Retinal detachment    right eye; "no OR" (07/23/2016)  . Sleep apnea    cpap does not use (study done 10+ yrs) - has lost weight  . Type II diabetes mellitus ([place])     Past Surgical History:  Procedure Laterality Date  . ANKLE FRACTURE SURGERY Right 1980s   broken and reset    . BASAL CELL CARCINOMA EXCISION Left X 2   arm  . COLONOSCOPY    . FRACTURE SURGERY    . JOINT REPLACEMENT    . KNEE ARTHROSCOPY  07/10/2012   Procedure: ARTHROSCOPY KNEE;  Surgeon: Newt Minion, MD;  Location: [place];  Service: Orthopedics;  Laterality: Right;  Right knee arthroscopy and debridement, chrondralplasty  . KNEE ARTHROSCOPY Left 10/27/2015   Procedure: Left Knee Arthroscopy and Debridement;  Surgeon: Newt Minion, MD;  Location: [place];  Service: Orthopedics;  Laterality: Left;  . SEPTOPLASTY     x2  . TOTAL KNEE ARTHROPLASTY Left 07/23/2016  . TOTAL KNEE  ARTHROPLASTY Left 07/23/2016   Procedure: LEFT TOTAL KNEE ARTHROPLASTY;  Surgeon: Melrose Nakayama, MD;  Location: [place];  Service: Orthopedics;  Laterality: Left;    There were no vitals filed for this visit.       Subjective Assessment - 08/22/16 1024    Subjective Pt arriving to therapy today s/p Left TRA on  07/23/16, pt received Home Health therapy for 3 weeks. Pt reporting pain of 4/10 at rest. Intermittent pain meds. yesterday pt reporting increased walking yesterday he reported increased pain.    How long can you sit comfortably? 5 minutes   How long can you stand comfortably? 5 minutes   How long can you walk comfortably? 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    Currently in Pain? Yes   Pain Score 4    Pain Location Knee   Pain Orientation Left   Pain Descriptors / Indicators Aching;Tightness   Pain Type Surgical pain   Pain Onset 1 to 4 weeks ago   Pain Frequency Constant   Aggravating Factors  standing, not changing positions, walking, being in a dependent position   Pain Relieving Factors elevation, pain meds, ice   Effect of  Pain on Daily Activities driving, donning socks and shoes, getting in and out of tub.    Multiple Pain Sites No            [facility] PT Assessment - 08/22/16 0001      Assessment   Medical Diagnosis left TRA   Referring Provider Shelly Coss, MD   Onset Date/Surgical Date 07/23/16   Hand Dominance Right   Prior Therapy yes, after arthoscopy in 2/17     Precautions   Precautions None     Restrictions   Weight Bearing Restrictions No     Balance Screen   Has the patient fallen in the past 6 months Yes   How many times? 1  before TRA   Has the patient had a decrease in activity level because of a fear of falling?  Yes   Is the patient reluctant to leave their home because of a fear of falling?  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, transferring patients   Leisure hike, walking dogs, yard work     Charity fundraiser Status Within Functional Limits for tasks assessed     Observation/Other Assessments-Edema    Edema --  surgical edema left knee     Sensation   Light Touch Impaired by gross assessment     ROM / Strength   AROM / PROM / Strength AROM;PROM;Strength     AROM   AROM Assessment Site Knee   Right/Left Knee Right;Left   Right Knee Extension 0   Right Knee Flexion 108   Left Knee Extension 10   Left Knee Flexion 80     PROM   PROM Assessment Site Knee   Right/Left Knee Left   Left Knee Extension 8   Left Knee Flexion 90     Strength   Strength Assessment Site Hip;Knee   Right/Left Hip Right;Left   Right Hip Flexion 5/5   Right Hip Extension 5/5   Right Hip ABduction 4/5   Right Hip ADduction 4/5   Left Hip Flexion 4/5   Left Hip Extension 4/5   Left Hip ABduction 4/5   Left Hip ADduction 4/5   Right/Left Knee Left   Left Knee Flexion 3/5   Left Knee Extension 3/5     Palpation   Patella mobility joint effusion, swelling noted from surgical site     Transfers   Comments 30 second sit to stand = 3 times     Ambulation/Gait   Ambulation/Gait Yes   Ambulation/Gait Assistance 6: Modified independent (Device/Increase time)   Ambulation Distance (Feet) 75 Feet   Assistive device Straight cane   Gait Pattern Step-to pattern;Decreased stance time -  left;Decreased hip/knee flexion - left   Ambulation Surface Level;Indoor  PT Education - 08/22/16 1342    Education provided Yes   Education Details HEP   Person(s) Educated Patient   Methods Explanation;Demonstration;Tactile cues;Verbal cues;Handout   Comprehension Returned demonstration;Verbal cues required;Tactile cues required;Verbalized understanding          PT Short Term Goals - 08/22/16 1348      PT SHORT TERM GOAL #1   Title Pt will be I with HEP to improve functional mobility.    Time 4   Period Weeks   Status New     PT SHORT TERM GOAL #2   Title Pt will be able to walk without assistive device for short distances,<100 feet with step through gait pattern.    Baseline currently using a cane.     PT SHORT TERM GOAL #3   Title Pt will be able to report strategies for RICE, edema mgmt   Baseline has equipment to do at home.  Neopreen sleeve with pockets for cold packs.   Time 4   Period Weeks   Status New           PT Long Term Goals - 08/22/16 1351      PT LONG TERM GOAL #1   Title Pt will be I with more advanced HEP for  left knee and bilateral hips.   Time 8   Period Weeks   Status New     PT LONG TERM GOAL #2   Title Pt will be able to push/pull up to 50# with no increase in knee pain in prep for return to work.    Period Weeks   Status New     PT LONG TERM GOAL #3   Title Pt will demo 5/5 strength in bilateral LEs for improved function at work.    Time 8   Period Weeks   Status New     PT LONG TERM GOAL #4   Title Pt will report no UE assistance needed for standing from low surface   Baseline currently using bilateral UEs for push off.    Period Weeks   Status New               Plan - 08/22/16 1343    Clinical Impression Statement Pt arriving to therapy 4 weeks s/p L TKR. Pt reporting pain of 3/10. Pt taking pain meds as needed. 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 pt presenting with decreased ROM and strength in his L LE and mild hip weakness in his R LE. Skilled physicla therapy needed to assist pt toward PLOF with the below interventions.    Rehab Potential Good   PT Frequency 2x / week   PT Duration 8 weeks   PT Treatment/Interventions ADLs/Self Care Home Management;Cryotherapy;Electrical Stimulation;Iontophoresis 4mg /ml Dexamethasone;Passive range of motion;Vasopneumatic Device;Manual techniques;Taping;Therapeutic activities;Therapeutic exercise;Patient/family education;Functional mobility training;Stair training;Gait training   PT Next Visit Plan progress total knee exercises, patella mobs   PT Home Exercise Plan hamstring stretches, quadsets, SLR, mini squats   Consulted and Agree with Plan of Care Patient      Patient will benefit from skilled therapeutic intervention in order to improve the following deficits and impairments:  Decreased mobility, Decreased strength, Increased edema, Decreased balance, Decreased activity tolerance, Impaired perceived functional ability, Difficulty walking, Decreased range of motion, Pain  Visit Diagnosis: Knee stiffness, left  Acute pain of left knee  Difficulty walking     Problem List Patient Active Problem List   Diagnosis Date  Noted  . Primary osteoarthritis of left knee 07/23/2016  . 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 Diabetes ([place]) 03/01/2013    Oretha Caprice, MPT 08/22/2016, 1:59 PM  [facility] [address] [place], [place], [address] Phone: [phone]   Fax:  [phone]  Name: [name] MRN: [id number] Date of Birth: 03-20-1956

## 2016-08-27 ENCOUNTER — Ambulatory Visit: Payer: 59 | Admitting: Physical Therapy

## 2016-08-27 DIAGNOSIS — Z9889 Other specified postprocedural states: Secondary | ICD-10-CM | POA: Diagnosis not present

## 2016-08-27 DIAGNOSIS — R262 Difficulty in walking, not elsewhere classified: Secondary | ICD-10-CM

## 2016-08-27 DIAGNOSIS — M25662 Stiffness of left knee, not elsewhere classified: Secondary | ICD-10-CM | POA: Diagnosis not present

## 2016-08-27 DIAGNOSIS — M25661 Stiffness of right knee, not elsewhere classified: Secondary | ICD-10-CM

## 2016-08-27 DIAGNOSIS — M25562 Pain in left knee: Secondary | ICD-10-CM | POA: Diagnosis not present

## 2016-08-27 NOTE — Therapy (Addendum)
[place], [place], [address] Phone: [phone]   Fax:  [phone]  Physical Therapy Treatment  Patient Details  Name: Jeff Edwards. MRN: [id number] Date of Birth: [birth year] Referring Provider: Shelly Coss, MD  Encounter Date: 08/27/2016      PT End of Session - 08/27/16 0959    Visit Number 2   Date for PT Re-Evaluation 10/22/16   PT Start Time 0935   PT Stop Time 1015   PT Time Calculation (min) 40 min   Activity Tolerance Patient tolerated treatment well   Behavior During Therapy [facility] for tasks assessed/performed      Past Medical History:  Diagnosis Date  . ADHD (attention deficit hyperactivity disorder)   . Anemia    as a child  . 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 Basal cell carcinoma of left forearm   . Diverticulitis   . GERD (gastroesophageal reflux disease)   . High cholesterol   . Hypertension   . Memory change   . Neuropathy ([place])    in feet  . Osteoarthritis of both knees   . Retinal detachment    right eye; "no OR" (07/23/2016)  . Sleep apnea    cpap does not use (study done 10+ yrs) - has lost weight  . Type II diabetes mellitus ([place])     Past Surgical History:  Procedure Laterality Date  . ANKLE FRACTURE SURGERY Right 1980s   broken and reset    . BASAL CELL CARCINOMA EXCISION Left X 2   arm  . COLONOSCOPY    . FRACTURE SURGERY    . JOINT REPLACEMENT    . KNEE ARTHROSCOPY  07/10/2012   Procedure: ARTHROSCOPY KNEE;  Surgeon: Newt Minion, MD;  Location: [place];  Service: Orthopedics;  Laterality: Right;  Right knee arthroscopy and debridement, chrondralplasty  . KNEE ARTHROSCOPY Left 10/27/2015   Procedure: Left Knee Arthroscopy and Debridement;  Surgeon: Newt Minion, MD;  Location: [place];  Service: Orthopedics;  Laterality: Left;  . SEPTOPLASTY     x2  . TOTAL KNEE ARTHROPLASTY Left 07/23/2016  . TOTAL KNEE ARTHROPLASTY Left  07/23/2016   Procedure: LEFT TOTAL KNEE ARTHROPLASTY;  Surgeon: [place] Nakayama, MD;  Location: [place];  Service: Orthopedics;  Laterality: Left;    There were no vitals filed for this visit.      Subjective Assessment - 08/27/16 0940    Subjective pt arriving to therapy s/p left TKR on 07/23/16. Pt reported doing some household chores this weekend requiring increased time for activities. Pain reported at 2/10.  Pt taking pain meds at [place] due to not sleeping.    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    Currently in Pain? Yes   Pain Score 2    Pain Location Knee   Pain Orientation Left   Pain Descriptors / Indicators Aching;Tightness   Pain Type Surgical pain   Pain Frequency Constant   Aggravating Factors  standing, not changing positions, walking being in a dependent position   Pain Relieving Factors elevation, pain meds, ice   Effect of Pain on Daily Activities driving, donning socks and shoes, getting in and out of tub.    Multiple Pain Sites No            [facility] PT Assessment - 08/27/16 0001      AROM   Left  Knee Extension 6   Left Knee Flexion 95                     [facility] Adult PT Treatment/Exercise - 08/27/16 0001      Exercises   Exercises Knee/Hip     Knee/Hip Exercises: Stretches   Active Hamstring Stretch Left;3 reps;30 seconds   Active Hamstring Stretch Limitations using green strip     Knee/Hip Exercises: Aerobic   Recumbent Bike 8 minutes level 2     Knee/Hip Exercises: Standing   Other Standing Knee Exercises sit to stand x 10 using UE support.      Knee/Hip Exercises: Supine   Quad Sets AROM;Strengthening;20 reps   Short Arc Quad Sets 2 sets;10 reps   Straight Leg Raises Strengthening;2 sets;10 reps   Patellar Mobs pt with mild joint swelling. Inferior/superior, medial/lateral mobs performed.      Knee/Hip Exercises: Sidelying   Hip ABduction Strengthening;2 sets;10  reps     Modalities   Modalities Vasopneumatic     Vasopneumatic   Number Minutes Vasopneumatic  15 minutes   Vasopnuematic Location  Knee   Vasopneumatic Pressure Low   Vasopneumatic Temperature  2 snowflakes     Manual Therapy   Manual Therapy Joint mobilization   Manual therapy comments patellas and soft tissue, scar tissue cross friction                PT Education - 08/27/16 0957    Education provided Yes   Education Details Importance og exercise and continued motion, reviewed HEP   Person(s) Educated Patient   Methods Explanation;Demonstration   Comprehension Verbalized understanding          PT Short Term Goals - 08/27/16 1008      PT SHORT TERM GOAL #1   Title Pt will be I with HEP to improve functional mobility.    Time 4   Period Weeks   Status New     PT SHORT TERM GOAL #2   Baseline currently using a cane.   Time 4   Period Weeks   Status On-going     PT SHORT TERM GOAL #3   Title Pt will be able to report strategies for RICE, edema mgmt   Baseline has equipment to do at home.  Neopreen sleeve with pockets for cold packs.   Time 4   Period Weeks   Status New           PT Long Term Goals - 08/22/16 1351      PT LONG TERM GOAL #1   Title Pt will be I with more advanced HEP for  left knee and bilateral hips.   Time 8   Period Weeks   Status New     PT LONG TERM GOAL #2   Title Pt will be able to push/pull up to 50# with no increase in knee pain in prep for return to work.    Period Weeks   Status New     PT LONG TERM GOAL #3   Title Pt will demo 5/5 strength in bilateral LEs for improved function at work.    Time 8   Period Weeks   Status New     PT LONG TERM GOAL #4   Title Pt will report no UE assistance needed for standing from low surface   Baseline currently using bilateral UEs for push off.    Period Weeks   Status New  Plan - 08/27/16 0959    Clinical Impression Statement Pt tolerating  exercise program well today. Pt reporting pain of 2/10 upon arrival and 2/10 when completing his treatment. Pt's HEP reviewed. Began active stretching and strengthening to the left hip and knee. Skilled PT needed to progess toward goals set.    Rehab Potential Good   PT Frequency 2x / week   PT Duration 8 weeks   PT Treatment/Interventions ADLs/Self Care Home Management;Cryotherapy;Electrical Stimulation;Iontophoresis 4mg /ml Dexamethasone;Passive range of motion;Vasopneumatic Device;Manual techniques;Taping;Therapeutic activities;Therapeutic exercise;Patient/family education;Functional mobility training;Stair training;Gait training   PT Next Visit Plan progress total knee exercises, patella mobs, wall squats   PT Home Exercise Plan hamstring stretches, quadsets, SLR, mini squats   Consulted and Agree with Plan of Care Patient      Patient will benefit from skilled therapeutic intervention in order to improve the following deficits and impairments:  Decreased mobility, Decreased strength, Increased edema, Decreased balance, Decreased activity tolerance, Impaired perceived functional ability, Difficulty walking, Decreased range of motion, Pain  Visit Diagnosis: Knee stiffness, left  Acute pain of left knee  Difficulty walking  Stiffness of knee joint, right     Problem List Patient Active Problem List   Diagnosis Date Noted  . Primary osteoarthritis of left knee 07/23/2016  . 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 Diabetes ([place]) 03/01/2013    Oretha Caprice , MPT 08/27/2016, 2:38 PM  [facility] [address] [place], [place], [address] Phone: [phone]   Fax:  [phone]  Name: [name] MRN: [id number] Date of Birth: 1955/12/09

## 2016-08-29 ENCOUNTER — Ambulatory Visit: Payer: 59

## 2016-08-29 DIAGNOSIS — R262 Difficulty in walking, not elsewhere classified: Secondary | ICD-10-CM | POA: Diagnosis not present

## 2016-08-29 DIAGNOSIS — Z9889 Other specified postprocedural states: Secondary | ICD-10-CM

## 2016-08-29 DIAGNOSIS — M25661 Stiffness of right knee, not elsewhere classified: Secondary | ICD-10-CM

## 2016-08-29 DIAGNOSIS — M25662 Stiffness of left knee, not elsewhere classified: Secondary | ICD-10-CM

## 2016-08-29 DIAGNOSIS — M25562 Pain in left knee: Secondary | ICD-10-CM | POA: Diagnosis not present

## 2016-08-29 NOTE — Therapy (Signed)
[place], [place], [address] Phone: [phone]   Fax:  [phone]  Physical Therapy Treatment  Patient Details  Name: Jeff Edwards. MRN: [id number] Date of Birth: June 10, 1956 Referring Provider: Shelly Coss, MD  Encounter Date: 08/29/2016      PT End of Session - 08/29/16 0933    Visit Number 3   Number of Visits 16   Date for PT Re-Evaluation 10/22/16   PT Start Time 0932   PT Stop Time 1025   PT Time Calculation (min) 53 min   Activity Tolerance Patient tolerated treatment well   Behavior During Therapy [facility] for tasks assessed/performed      Past Medical History:  Diagnosis Date  . ADHD (attention deficit hyperactivity disorder)   . Anemia    as a child  . 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 Basal cell carcinoma of left forearm   . Diverticulitis   . GERD (gastroesophageal reflux disease)   . High cholesterol   . Hypertension   . Memory change   . Neuropathy ([place])    in feet  . Osteoarthritis of both knees   . Retinal detachment    right eye; "no OR" (07/23/2016)  . Sleep apnea    cpap does not use (study done 10+ yrs) - has lost weight  . Type II diabetes mellitus ([place])     Past Surgical History:  Procedure Laterality Date  . ANKLE FRACTURE SURGERY Right 1980s   broken and reset    . BASAL CELL CARCINOMA EXCISION Left X 2   arm  . COLONOSCOPY    . FRACTURE SURGERY    . JOINT REPLACEMENT    . KNEE ARTHROSCOPY  07/10/2012   Procedure: ARTHROSCOPY KNEE;  Surgeon: Newt Minion, MD;  Location: [place];  Service: Orthopedics;  Laterality: Right;  Right knee arthroscopy and debridement, chrondralplasty  . KNEE ARTHROSCOPY Left 10/27/2015   Procedure: Left Knee Arthroscopy and Debridement;  Surgeon: Newt Minion, MD;  Location: [place];  Service: Orthopedics;  Laterality: Left;  . SEPTOPLASTY     x2  . TOTAL KNEE ARTHROPLASTY Left 07/23/2016  . TOTAL KNEE  ARTHROPLASTY Left 07/23/2016   Procedure: LEFT TOTAL KNEE ARTHROPLASTY;  Surgeon: Melrose Nakayama, MD;  Location: [place];  Service: Orthopedics;  Laterality: Left;    There were no vitals filed for this visit.      Subjective Assessment - 08/29/16 0933    Subjective Can't sleep at night due to pain but now 1-2/10   Currently in Pain? Yes   Pain Score 2    Pain Location Knee   Pain Orientation Left;Anterior   Pain Descriptors / Indicators Aching;Tightness   Pain Type Surgical pain   Pain Onset 1 to 4 weeks ago   Pain Frequency Constant   Aggravating Factors  stand walking,  not change postion,   Pain Relieving Factors meds ice elevation   Multiple Pain Sites No            [facility] PT Assessment - 08/29/16 0001      AROM   Left Knee Flexion 94                     [facility] Adult PT Treatment/Exercise - 08/29/16 0936      Knee/Hip Exercises: Aerobic   Recumbent Bike L2 5 min     Knee/Hip Exercises: Seated   Long Arc Quad Strengthening;Left  25 reps  Long Arc Quad Weight 5 lbs.   Long Arc Quad Limitations 5 sec   Sit to [employer] 15 reps;without UE support  from the mat     Knee/Hip Exercises: Supine   Quad Sets Left   Quad Sets Limitations Worked on quad contraction using tactile and verbal cues   Public librarian reps   Straight Leg Raises Left;15 reps     Knee/Hip Exercises: Prone   Hamstring Curl --  25 reps   Hamstring Curl Limitations 5 pounds   Straight Leg Raises Left;20 reps;AROM     Modalities   Modalities Cryotherapy     Cryotherapy   Number Minutes Cryotherapy 12 Minutes   Cryotherapy Location Knee   Type of Cryotherapy Ice pack     Manual Therapy   Manual therapy comments patellas and soft tissue,AP glides Gr 4  tibia and sustained flexion 15-30 sec x 8 reps                  PT Short Term Goals - 08/29/16 1015      PT SHORT TERM GOAL #1   Title Pt will be I with HEP to improve functional mobility.    Status  On-going     PT SHORT TERM GOAL #2   Title Pt will be able to walk without assistive device for short distances,<100 feet with step through gait pattern.    Status Achieved     PT SHORT TERM GOAL #3   Status Achieved           PT Long Term Goals - 08/22/16 1351      PT LONG TERM GOAL #1   Title Pt will be I with more advanced HEP for  left knee and bilateral hips.   Time 8   Period Weeks   Status New     PT LONG TERM GOAL #2   Title Pt will be able to push/pull up to 50# with no increase in knee pain in prep for return to work.    Period Weeks   Status New     PT LONG TERM GOAL #3   Title Pt will demo 5/5 strength in bilateral LEs for improved function at work.    Time 8   Period Weeks   Status New     PT LONG TERM GOAL #4   Title Pt will report no UE assistance needed for standing from low surface   Baseline currently using bilateral UEs for push off.    Period Weeks   Status New               Plan - 08/29/16 0933    Clinical Impression Statement Pt with issues sleeping due to pain but generally minimal pain except post exercises. Tried cold pack and vaso too cold last time.  He is doing well and should continue to progress   PT Treatment/Interventions ADLs/Self Care Home Management;Cryotherapy;Electrical Stimulation;Iontophoresis 4mg /ml Dexamethasone;Passive range of motion;Vasopneumatic Device;Manual techniques;Taping;Therapeutic activities;Therapeutic exercise;Patient/family education;Functional mobility training;Stair training;Gait training   PT Next Visit Plan progress total knee exercises, patella mobs, wall squats, ROM   Consulted and Agree with Plan of Care Patient      Patient will benefit from skilled therapeutic intervention in order to improve the following deficits and impairments:  Decreased mobility, Decreased strength, Increased edema, Decreased balance, Decreased activity tolerance, Impaired perceived functional ability, Difficulty walking,  Decreased range of motion, Pain  Visit Diagnosis: Knee stiffness, left  Acute pain of left  knee  Difficulty walking  Stiffness of knee joint, right  S/P left knee arthroscopy     Problem List Patient Active Problem List   Diagnosis Date Noted  . Primary osteoarthritis of left knee 07/23/2016  . 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 Diabetes ([place]) 03/01/2013    Darrel Hoover  PT 08/29/2016, 10:17 AM  [facility] [address] [place], [place], [address] Phone: [phone]   Fax:  [phone]  Name: Jeff Edwards. MRN: [id number] Date of Birth: January 17, 1956

## 2016-09-03 ENCOUNTER — Ambulatory Visit: Payer: 59 | Admitting: Physical Therapy

## 2016-09-03 DIAGNOSIS — Z9889 Other specified postprocedural states: Secondary | ICD-10-CM | POA: Diagnosis not present

## 2016-09-03 DIAGNOSIS — R262 Difficulty in walking, not elsewhere classified: Secondary | ICD-10-CM

## 2016-09-03 DIAGNOSIS — M25662 Stiffness of left knee, not elsewhere classified: Secondary | ICD-10-CM | POA: Diagnosis not present

## 2016-09-03 DIAGNOSIS — M25562 Pain in left knee: Secondary | ICD-10-CM | POA: Diagnosis not present

## 2016-09-03 DIAGNOSIS — M25661 Stiffness of right knee, not elsewhere classified: Secondary | ICD-10-CM | POA: Diagnosis not present

## 2016-09-03 NOTE — Therapy (Signed)
[place], [place], [address] Phone: [phone]   Fax:  [phone]  Physical Therapy Treatment  Patient Details  Name: Jeff Edwards. MRN: [id number] Date of Birth: 05-28-1956 Referring Provider: Shelly Coss, MD  Encounter Date: 09/03/2016      PT End of Session - 09/03/16 0846    Visit Number 4   Number of Visits 16   Date for PT Re-Evaluation 10/22/16   PT Start Time 0845   PT Stop Time 0940   PT Time Calculation (min) 55 min      Past Medical History:  Diagnosis Date  . ADHD (attention deficit hyperactivity disorder)   . Anemia    as a child  . 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 Basal cell carcinoma of left forearm   . Diverticulitis   . GERD (gastroesophageal reflux disease)   . High cholesterol   . Hypertension   . Memory change   . Neuropathy ([place])    in feet  . Osteoarthritis of both knees   . Retinal detachment    right eye; "no OR" (07/23/2016)  . Sleep apnea    cpap does not use (study done 10+ yrs) - has lost weight  . Type II diabetes mellitus ([place])     Past Surgical History:  Procedure Laterality Date  . ANKLE FRACTURE SURGERY Right 1980s   broken and reset    . BASAL CELL CARCINOMA EXCISION Left X 2   arm  . COLONOSCOPY    . FRACTURE SURGERY    . JOINT REPLACEMENT    . KNEE ARTHROSCOPY  07/10/2012   Procedure: ARTHROSCOPY KNEE;  Surgeon: Newt Minion, MD;  Location: [facility];  Service: Orthopedics;  Laterality: Right;  Right knee arthroscopy and debridement, chrondralplasty  . KNEE ARTHROSCOPY Left 10/27/2015   Procedure: Left Knee Arthroscopy and Debridement;  Surgeon: Newt Minion, MD;  Location: [facility];  Service: Orthopedics;  Laterality: Left;  . SEPTOPLASTY     x2  . TOTAL KNEE ARTHROPLASTY Left 07/23/2016  . TOTAL KNEE ARTHROPLASTY Left 07/23/2016   Procedure: LEFT TOTAL KNEE ARTHROPLASTY;  Surgeon: Melrose Nakayama, MD;  Location: [facility];  Service: Orthopedics;  Laterality: Left;    There were no vitals filed for this visit.      Subjective Assessment - 09/03/16 0845    Subjective Still have sharp pains at night, not sure if nerves are waking up.    Currently in Pain? No/denies            [facility] PT Assessment - 09/03/16 0001      AROM   Left Knee Extension 5   Left Knee Flexion 96     PROM   Left Knee Extension 0   Left Knee Flexion 100                     [facility] Adult PT Treatment/Exercise - 09/03/16 0001      Ambulation/Gait   Pre-Gait Activities retro stepping for Terminal knee extension      Knee/Hip Exercises: Stretches   Knee: Self-Stretch to increase Flexion 3 reps;30 seconds   Knee: Self-Stretch Limitations prone with strap      Knee/Hip Exercises: Aerobic   Recumbent Bike L2 5 min     Knee/Hip Exercises: Standing   Lateral Step Up 15 reps;Step Height: 6"   Forward Step Up 15 reps;Step Height: 6"   Step Down 10 reps;Step Height:  4"   Other Standing Knee Exercises terminal knee extension green band 10 reps with LLE forward, 10  reps LLE back      Knee/Hip Exercises: Seated   Hamstring Curl 20 reps   Hamstring Limitations green band   Sit to Sand 15 reps;without UE support  from the mat     Knee/Hip Exercises: Supine   Quad Sets Left;20 reps   Quad Sets Limitations tactile and verbal cues   Patellar Mobs all planes     Knee/Hip Exercises: Prone   Hamstring Curl --  25 reps     Cryotherapy   Number Minutes Cryotherapy 15 Minutes   Cryotherapy Location Knee   Type of Cryotherapy Ice pack                  PT Short Term Goals - 09/03/16 0920      PT SHORT TERM GOAL #1   Title Pt will be I with HEP to improve functional mobility.    Time 4   Period Weeks   Status Achieved     PT SHORT TERM GOAL #2   Title Pt will be able to walk without assistive device for short distances,<100 feet with step through gait pattern.    Time 4   Period Weeks   Status  Achieved     PT SHORT TERM GOAL #3   Title Pt will be able to report strategies for RICE, edema mgmt   Time 4   Period Weeks   Status Achieved           PT Long Term Goals - 08/22/16 1351      PT LONG TERM GOAL #1   Title Pt will be I with more advanced HEP for  left knee and bilateral hips.   Time 8   Period Weeks   Status New     PT LONG TERM GOAL #2   Title Pt will be able to push/pull up to 50# with no increase in knee pain in prep for return to work.    Period Weeks   Status New     PT LONG TERM GOAL #3   Title Pt will demo 5/5 strength in bilateral LEs for improved function at work.    Time 8   Period Weeks   Status New     PT LONG TERM GOAL #4   Title Pt will report no UE assistance needed for standing from low surface   Baseline currently using bilateral UEs for push off.    Period Weeks   Status New               Plan - 09/03/16 2334    Clinical Impression Statement Pt reports he was able to reach his feet to cut his toe nails. His Knee AROM and PROM has improved. Began step exercises today with good tolerance. All STGs Met.    PT Next Visit Plan progress total knee exercises, patella mobs, wall squats, ROM, step exercises    PT Home Exercise Plan hamstring stretches, quadsets, SLR, mini squats   Consulted and Agree with Plan of Care Patient      Patient will benefit from skilled therapeutic intervention in order to improve the following deficits and impairments:  Decreased mobility, Decreased strength, Increased edema, Decreased balance, Decreased activity tolerance, Impaired perceived functional ability, Difficulty walking, Decreased range of motion, Pain  Visit Diagnosis: Knee stiffness, left  Acute pain of left knee  Difficulty walking  Problem List Patient Active Problem List   Diagnosis Date Noted  . Primary osteoarthritis of left knee 07/23/2016  . 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 Diabetes ([place])  03/01/2013    Dorene Ar, PTA 09/03/2016, 9:31 AM  [facility] [address] [place], [place], [address] Phone: [phone]   Fax:  [phone]  Name: [name] MRN: [id number] Date of Birth: 1955-12-28

## 2016-09-05 ENCOUNTER — Ambulatory Visit: Payer: 59

## 2016-09-05 DIAGNOSIS — M25562 Pain in left knee: Secondary | ICD-10-CM | POA: Diagnosis not present

## 2016-09-05 DIAGNOSIS — R262 Difficulty in walking, not elsewhere classified: Secondary | ICD-10-CM | POA: Diagnosis not present

## 2016-09-05 DIAGNOSIS — Z9889 Other specified postprocedural states: Secondary | ICD-10-CM | POA: Diagnosis not present

## 2016-09-05 DIAGNOSIS — M25661 Stiffness of right knee, not elsewhere classified: Secondary | ICD-10-CM

## 2016-09-05 DIAGNOSIS — M25662 Stiffness of left knee, not elsewhere classified: Secondary | ICD-10-CM | POA: Diagnosis not present

## 2016-09-05 NOTE — Therapy (Signed)
[place], [place], [address] Phone: [phone]   Fax:  [phone]  Physical Therapy Treatment  Patient Details  Name: Jeff Edwards. MRN: [id number] Date of Birth: 02/03/1956 Referring Provider: Shelly Coss, MD  Encounter Date: 09/05/2016      PT End of Session - 09/05/16 1152    Visit Number 5   Number of Visits 16   Date for PT Re-Evaluation 10/22/16   PT Start Time [id number]   PT Stop Time 1230   PT Time Calculation (min) 45 min   Activity Tolerance Patient tolerated treatment well   Behavior During Therapy [facility] for tasks assessed/performed      Past Medical History:  Diagnosis Date  . ADHD (attention deficit hyperactivity disorder)   . Anemia    as a child  . 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 Basal cell carcinoma of left forearm   . Diverticulitis   . GERD (gastroesophageal reflux disease)   . High cholesterol   . Hypertension   . Memory change   . Neuropathy ([place])    in feet  . Osteoarthritis of both knees   . Retinal detachment    right eye; "no OR" (07/23/2016)  . Sleep apnea    cpap does not use (study done 10+ yrs) - has lost weight  . Type II diabetes mellitus ([place])     Past Surgical History:  Procedure Laterality Date  . ANKLE FRACTURE SURGERY Right 1980s   broken and reset    . BASAL CELL CARCINOMA EXCISION Left X 2   arm  . COLONOSCOPY    . FRACTURE SURGERY    . JOINT REPLACEMENT    . KNEE ARTHROSCOPY  07/10/2012   Procedure: ARTHROSCOPY KNEE;  Surgeon: Newt Minion, MD;  Location: [facility];  Service: Orthopedics;  Laterality: Right;  Right knee arthroscopy and debridement, chrondralplasty  . KNEE ARTHROSCOPY Left 10/27/2015   Procedure: Left Knee Arthroscopy and Debridement;  Surgeon: Newt Minion, MD;  Location: [facility];  Service: Orthopedics;  Laterality: Left;  . SEPTOPLASTY     x2  . TOTAL KNEE ARTHROPLASTY Left 07/23/2016  . TOTAL KNEE  ARTHROPLASTY Left 07/23/2016   Procedure: LEFT TOTAL KNEE ARTHROPLASTY;  Surgeon: Melrose Nakayama, MD;  Location: [facility];  Service: Orthopedics;  Laterality: Left;    There were no vitals filed for this visit.                       [facility] Adult PT Treatment/Exercise - 09/05/16 0001      Neuro Re-ed    Neuro Re-ed Details  Balance with slight flexionn of knees and forward and sideway 45  degrees stepping RT and LT x 6-8 reps     Knee/Hip Exercises: Aerobic   Recumbent Bike L2 7 min   Nustep L8 x 6 min LE     Knee/Hip Exercises: Standing   Lateral Step Up 15 reps;Step Height: 8";Left   Forward Step Up 15 reps;Step Height: 8";Left     Knee/Hip Exercises: Seated   Long Arc Quad Strengthening;Left   Long Arc Quad Weight 7 lbs.   Long Arc Quad Limitations 5 sec   Hamstring Limitations green band 25 reps LT   Sit to [employer] 15 reps;without UE support  mat                  PT Short Term Goals - 09/03/16 0920  PT SHORT TERM GOAL #1   Title Pt will be I with HEP to improve functional mobility.    Time 4   Period Weeks   Status Achieved     PT SHORT TERM GOAL #2   Title Pt will be able to walk without assistive device for short distances,<100 feet with step through gait pattern.    Time 4   Period Weeks   Status Achieved     PT SHORT TERM GOAL #3   Title Pt will be able to report strategies for RICE, edema mgmt   Time 4   Period Weeks   Status Achieved           PT Long Term Goals - 08/22/16 1351      PT LONG TERM GOAL #1   Title Pt will be I with more advanced HEP for  left knee and bilateral hips.   Time 8   Period Weeks   Status New     PT LONG TERM GOAL #2   Title Pt will be able to push/pull up to 50# with no increase in knee pain in prep for return to work.    Period Weeks   Status New     PT LONG TERM GOAL #3   Title Pt will demo 5/5 strength in bilateral LEs for improved function at work.    Time 8   Period Weeks   Status New      PT LONG TERM GOAL #4   Title Pt will report no UE assistance needed for standing from low surface   Baseline currently using bilateral UEs for push off.    Period Weeks   Status New               Plan - 09/05/16 1153    Clinical Impression Statement Mr Boyte did well with his exercises able to lift heavier weight and  increased height without pain . Empohasiszed hip recruitment .    PT Treatment/Interventions ADLs/Self Care Home Management;Cryotherapy;Electrical Stimulation;Iontophoresis 4mg /ml Dexamethasone;Passive range of motion;Vasopneumatic Device;Manual techniques;Taping;Therapeutic activities;Therapeutic exercise;Patient/family education;Functional mobility training;Stair training;Gait training   PT Next Visit Plan progress total knee exercises, patella mobs, wall squats, ROM, step exercises    PT Home Exercise Plan hamstring stretches, quadsets, SLR, mini squats   Consulted and Agree with Plan of Care Patient      Patient will benefit from skilled therapeutic intervention in order to improve the following deficits and impairments:  Decreased mobility, Decreased strength, Increased edema, Decreased balance, Decreased activity tolerance, Impaired perceived functional ability, Difficulty walking, Decreased range of motion, Pain  Visit Diagnosis: S/P left knee arthroscopy  Knee stiffness, left  Acute pain of left knee  Difficulty walking  Stiffness of knee joint, right     Problem List Patient Active Problem List   Diagnosis Date Noted  . Primary osteoarthritis of left knee 07/23/2016  . 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 Diabetes ([place]) 03/01/2013    Darrel Hoover  PT 09/05/2016, 12:33 PM  [facility] [address] [place], [place], [address] Phone: [phone]   Fax:  [phone]  Name: Jeff Edwards. MRN: [id number] Date of Birth: 09-Nov-1956

## 2016-09-09 MED FILL — METHOCARBAMOL 500 MG TABLET: 500 | 10 days supply | Qty: 40 | Fill #0

## 2016-09-09 MED FILL — GLIMEPIRIDE 2 MG TABLET: 2 | 90 days supply | Qty: 90 | Fill #1

## 2016-09-10 ENCOUNTER — Ambulatory Visit: Payer: 59 | Admitting: Physical Therapy

## 2016-09-10 DIAGNOSIS — Z9889 Other specified postprocedural states: Secondary | ICD-10-CM | POA: Diagnosis not present

## 2016-09-10 DIAGNOSIS — M25562 Pain in left knee: Secondary | ICD-10-CM | POA: Diagnosis not present

## 2016-09-10 DIAGNOSIS — M25662 Stiffness of left knee, not elsewhere classified: Secondary | ICD-10-CM | POA: Diagnosis not present

## 2016-09-10 DIAGNOSIS — M25661 Stiffness of right knee, not elsewhere classified: Secondary | ICD-10-CM | POA: Diagnosis not present

## 2016-09-10 DIAGNOSIS — R262 Difficulty in walking, not elsewhere classified: Secondary | ICD-10-CM

## 2016-09-10 NOTE — Therapy (Signed)
[place], [place], [address] Phone: [phone]   Fax:  [phone]  Physical Therapy Treatment  Patient Details  Name: Jeff Edwards. MRN: [id number] Date of Birth: 05-22-1956 Referring Provider: Shelly Coss, MD  Encounter Date: 09/10/2016      PT End of Session - 09/10/16 0852    Visit Number 6   Number of Visits 16   Date for PT Re-Evaluation 10/22/16   PT Start Time 0845   PT Stop Time 0930   PT Time Calculation (min) 45 min      Past Medical History:  Diagnosis Date  . ADHD (attention deficit hyperactivity disorder)   . Anemia    as a child  . 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 Basal cell carcinoma of left forearm   . Diverticulitis   . GERD (gastroesophageal reflux disease)   . High cholesterol   . Hypertension   . Memory change   . Neuropathy ([place])    in feet  . Osteoarthritis of both knees   . Retinal detachment    right eye; "no OR" (07/23/2016)  . Sleep apnea    cpap does not use (study done 10+ yrs) - has lost weight  . Type II diabetes mellitus ([place])     Past Surgical History:  Procedure Laterality Date  . ANKLE FRACTURE SURGERY Right 1980s   broken and reset    . BASAL CELL CARCINOMA EXCISION Left X 2   arm  . COLONOSCOPY    . FRACTURE SURGERY    . JOINT REPLACEMENT    . KNEE ARTHROSCOPY  07/10/2012   Procedure: ARTHROSCOPY KNEE;  Surgeon: Newt Minion, MD;  Location: [facility];  Service: Orthopedics;  Laterality: Right;  Right knee arthroscopy and debridement, chrondralplasty  . KNEE ARTHROSCOPY Left 10/27/2015   Procedure: Left Knee Arthroscopy and Debridement;  Surgeon: Newt Minion, MD;  Location: [facility];  Service: Orthopedics;  Laterality: Left;  . SEPTOPLASTY     x2  . TOTAL KNEE ARTHROPLASTY Left 07/23/2016  . TOTAL KNEE ARTHROPLASTY Left 07/23/2016   Procedure: LEFT TOTAL KNEE ARTHROPLASTY;  Surgeon: Melrose Nakayama, MD;  Location: [facility];  Service: Orthopedics;  Laterality: Left;    There were no vitals filed for this visit.                       [facility] Adult PT Treatment/Exercise - 09/10/16 0001      Knee/Hip Exercises: Aerobic   Elliptical L1 Ramp 1 x 4 minutes HR 156 bpm   Recumbent Bike L2 x 6 min     Knee/Hip Exercises: Machines for Strengthening   Cybex Knee Flexion 20# x 20 single leg      Knee/Hip Exercises: Standing   Heel Raises 20 reps   Lateral Step Up 15 reps;Step Height: 6"   Forward Step Up 15 reps;Step Height: 6"   Step Down 10 reps;Step Height: 4"   Step Down Limitations increased pain   Wall Squat 15 reps   SLS 5 sec best bilateral  tandem trials as well 30 sec best with left back    Other Standing Knee Exercises sit to stand with LLE back      Knee/Hip Exercises: Seated   Long Arc Quad 10 reps   Long Arc Quad Limitations green band +HEP   Hamstring Curl 10 reps   Hamstring Limitations green band x HEP.  PT Education - 09/10/16 1017    Education provided Yes   Education Details HEP   Person(s) Educated Patient   Methods Explanation;Handout   Comprehension Verbalized understanding          PT Short Term Goals - 09/03/16 0920      PT SHORT TERM GOAL #1   Title Pt will be I with HEP to improve functional mobility.    Time 4   Period Weeks   Status Achieved     PT SHORT TERM GOAL #2   Title Pt will be able to walk without assistive device for short distances,<100 feet with step through gait pattern.    Time 4   Period Weeks   Status Achieved     PT SHORT TERM GOAL #3   Title Pt will be able to report strategies for RICE, edema mgmt   Time 4   Period Weeks   Status Achieved           PT Long Term Goals - 08/22/16 1351      PT LONG TERM GOAL #1   Title Pt will be I with more advanced HEP for  left knee and bilateral hips.   Time 8   Period Weeks   Status New     PT LONG TERM GOAL #2   Title Pt will be able to push/pull  up to 50# with no increase in knee pain in prep for return to work.    Period Weeks   Status New     PT LONG TERM GOAL #3   Title Pt will demo 5/5 strength in bilateral LEs for improved function at work.    Time 8   Period Weeks   Status New     PT LONG TERM GOAL #4   Title Pt will report no UE assistance needed for standing from low surface   Baseline currently using bilateral UEs for push off.    Period Weeks   Status New               Plan - 09/10/16 0909    Clinical Impression Statement Mr Faustini would like to be able to hike in May with his wife. We began conditioning on Elliptical at level 1. Pt able to complete 4 minutes with HR at 156 Bpm.    PT Next Visit Plan Pt would like to try stair stepper for hiking prep, progress total knee exercises, patella mobs, wall squats, step exercises.    PT Home Exercise Plan hamstring stretches, quadsets, SLR, mini squats, wall slides, step ups, SLS, tandem stance, heel raises   Consulted and Agree with Plan of Care Patient      Patient will benefit from skilled therapeutic intervention in order to improve the following deficits and impairments:  Decreased mobility, Decreased strength, Increased edema, Decreased balance, Decreased activity tolerance, Impaired perceived functional ability, Difficulty walking, Decreased range of motion, Pain  Visit Diagnosis: S/P left knee arthroscopy  Knee stiffness, left  Acute pain of left knee  Difficulty walking     Problem List Patient Active Problem List   Diagnosis Date Noted  . Primary osteoarthritis of left knee 07/23/2016  . 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 Diabetes ([place]) 03/01/2013    Dorene Ar, PTA 09/10/2016, 10:20 AM  [facility] [address] [place], [place], [address] Phone: [phone]   Fax:  [phone]  Name: Jeff Edwards. MRN: [id number] Date of Birth:  12/16/1955    

## 2016-09-10 NOTE — Patient Instructions (Signed)
Knee Extension: Resisted (Sitting)   With band looped around right ankle and under other foot, straighten leg with ankle loop. Keep other leg bent to increase resistance. Repeat _10-20___ times per set. Do __1-2__ sets per session. Do __2__ sessions per day.  http://orth.exer.us/690   CKnee Flexion: Resisted (Sitting)   Sit with band under left foot and looped around ankle of supported leg. Pull unsupported leg back. Repeat __10-20__ times per set. Do __1-2__ sets per session. Do _2___ sessions per day.  http://orth.exer.us/695   Copyright  VHI. All rights reserved.        Heel Raise: Bilateral (Standing)   Rise on balls of feet. Repeat 10-20____ times per set. Do __1-2__ sets per session. Do 2____ sessions per day.     Heel Raise: Unilateral (Standing)   Balance on left foot, then rise on ball of foot. Repeat __10-20__ times per set. Do ___1-2_ sets per session. Do __2__ sessions per day.     SINGLE LIMB STANCE   Stance: single leg on floor. Raise leg. Hold _30__ seconds. Repeat with other leg. __1-2_ reps per set, __2_ sets per day, _7__ days per week  Copyright  VHI. All rights reserved.   Back Wall Slide    With feet _12-24___ inches from wall, lean as much of back against the wall as possible. Gently squat down _?__ inches, keeping back against wall. Hold __5__ seconds while counting out loud. Repeat __10-20__ times. Do __2__ sessions per day.  http://gt2.exer.us/563   Copyright  VHI. All rights reserved.

## 2016-09-12 ENCOUNTER — Ambulatory Visit: Payer: 59 | Admitting: Physical Therapy

## 2016-09-12 DIAGNOSIS — Z9889 Other specified postprocedural states: Secondary | ICD-10-CM | POA: Diagnosis not present

## 2016-09-12 DIAGNOSIS — M25562 Pain in left knee: Secondary | ICD-10-CM

## 2016-09-12 DIAGNOSIS — M25662 Stiffness of left knee, not elsewhere classified: Secondary | ICD-10-CM

## 2016-09-12 DIAGNOSIS — M25661 Stiffness of right knee, not elsewhere classified: Secondary | ICD-10-CM | POA: Diagnosis not present

## 2016-09-12 DIAGNOSIS — R262 Difficulty in walking, not elsewhere classified: Secondary | ICD-10-CM

## 2016-09-12 NOTE — Therapy (Signed)
[place], [place], [address] Phone: [phone]   Fax:  [phone]  Physical Therapy Treatment  Patient Details  Name: Jeff Edwards. MRN: [id number] Date of Birth: 03/14/1956 Referring Provider: Shelly Coss, MD  Encounter Date: 09/12/2016      PT End of Session - 09/12/16 1217    Visit Number 7   Number of Visits 16   Date for PT Re-Evaluation 10/22/16   PT Start Time [id number]   PT Stop Time 1230   PT Time Calculation (min) 45 min      Past Medical History:  Diagnosis Date  . ADHD (attention deficit hyperactivity disorder)   . Anemia    as a child  . 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 Basal cell carcinoma of left forearm   . Diverticulitis   . GERD (gastroesophageal reflux disease)   . High cholesterol   . Hypertension   . Memory change   . Neuropathy ([place])    in feet  . Osteoarthritis of both knees   . Retinal detachment    right eye; "no OR" (07/23/2016)  . Sleep apnea    cpap does not use (study done 10+ yrs) - has lost weight  . Type II diabetes mellitus ([place])     Past Surgical History:  Procedure Laterality Date  . ANKLE FRACTURE SURGERY Right 1980s   broken and reset    . BASAL CELL CARCINOMA EXCISION Left X 2   arm  . COLONOSCOPY    . FRACTURE SURGERY    . JOINT REPLACEMENT    . KNEE ARTHROSCOPY  07/10/2012   Procedure: ARTHROSCOPY KNEE;  Surgeon: Newt Minion, MD;  Location: [facility];  Service: Orthopedics;  Laterality: Right;  Right knee arthroscopy and debridement, chrondralplasty  . KNEE ARTHROSCOPY Left 10/27/2015   Procedure: Left Knee Arthroscopy and Debridement;  Surgeon: Newt Minion, MD;  Location: [facility];  Service: Orthopedics;  Laterality: Left;  . SEPTOPLASTY     x2  . TOTAL KNEE ARTHROPLASTY Left 07/23/2016  . TOTAL KNEE ARTHROPLASTY Left 07/23/2016   Procedure: LEFT TOTAL KNEE ARTHROPLASTY;  Surgeon: Melrose Nakayama, MD;  Location: [facility];  Service: Orthopedics;  Laterality: Left;    There were no vitals filed for this visit.      Subjective Assessment - 09/12/16 1254    Subjective Did good after last visit.    Currently in Pain? No/denies            [facility] PT Assessment - 09/12/16 0001      AROM   Left Knee Flexion 100                     [facility] Adult PT Treatment/Exercise - 09/12/16 0001      Knee/Hip Exercises: Stretches   Knee: Self-Stretch to increase Flexion 3 reps;30 seconds   Knee: Self-Stretch Limitations prone with strap      Knee/Hip Exercises: Aerobic   Elliptical L1 Ramp1 x 7 minutes  HR 128     Knee/Hip Exercises: Machines for Strengthening   Cybex Knee Flexion 30# x 20 single leg    Total Gym Leg Press 85# bilateral, 30# left x 20 each     Knee/Hip Exercises: Standing   Heel Raises 20 reps   Heel Raises Limitations also unilateral 10 each   Lateral Step Up 20 reps;Hand Hold: 1;Step Height: 6"   Forward Step Up 20 reps;Hand Hold: 1;Step  Height: 6"   Step Down 5 reps   Wall Squat 15 reps   SLS 8 sec best   Other Standing Knee Exercises sit to stand with LLE back                   PT Short Term Goals - 09/03/16 0920      PT SHORT TERM GOAL #1   Title Pt will be I with HEP to improve functional mobility.    Time 4   Period Weeks   Status Achieved     PT SHORT TERM GOAL #2   Title Pt will be able to walk without assistive device for short distances,<100 feet with step through gait pattern.    Time 4   Period Weeks   Status Achieved     PT SHORT TERM GOAL #3   Title Pt will be able to report strategies for RICE, edema mgmt   Time 4   Period Weeks   Status Achieved           PT Long Term Goals - 09/12/16 1256      PT LONG TERM GOAL #1   Title Pt will be I with more advanced HEP for  left knee and bilateral hips.   Baseline independent with those issued so far   Time 8   Period Weeks   Status On-going     PT LONG TERM GOAL #2   Title Pt  will be able to push/pull up to 50# with no increase in knee pain in prep for return to work.    Time 8   Period Weeks   Status Unable to assess     PT LONG TERM GOAL #3   Title Pt will demo 5/5 strength in bilateral LEs for improved function at work.    Time 8   Period Weeks   Status Unable to assess     PT LONG TERM GOAL #4   Title Pt will report no UE assistance needed for standing from low surface   Baseline currently using bilateral UEs for push off from sofa    Time 8   Period Weeks   Status On-going               Plan - 09/12/16 1244    Clinical Impression Statement Mr Garlow reports pain only at night while trying to sleep. Knee flexion AROM 100 degrees today. He is unable to rise from his sofa without UE assist,    PT Next Visit Plan Pt would like to try stair stepper for hiking prep, progress total knee exercises, patella mobs, wall squats, step exercises.    PT Home Exercise Plan hamstring stretches, quadsets, SLR, mini squats, wall slides, step ups, SLS, tandem stance, heel raises      Patient will benefit from skilled therapeutic intervention in order to improve the following deficits and impairments:  Decreased mobility, Decreased strength, Increased edema, Decreased balance, Decreased activity tolerance, Impaired perceived functional ability, Difficulty walking, Decreased range of motion, Pain  Visit Diagnosis: S/P left knee arthroscopy  Knee stiffness, left  Acute pain of left knee  Difficulty walking     Problem List Patient Active Problem List   Diagnosis Date Noted  . Primary osteoarthritis of left knee 07/23/2016  . 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 Diabetes ([place]) 03/01/2013    Dorene Ar, PTA 09/12/2016, 1:11 PM  [facility] [place],  [place], [address] Phone: [phone]   Fax:  [phone]  Name: Jeff Edwards. MRN:  [id number] Date of Birth: Apr 03, 1956

## 2016-09-17 ENCOUNTER — Ambulatory Visit: Payer: 59 | Admitting: Physical Therapy

## 2016-09-17 DIAGNOSIS — R262 Difficulty in walking, not elsewhere classified: Secondary | ICD-10-CM | POA: Diagnosis not present

## 2016-09-17 DIAGNOSIS — M25562 Pain in left knee: Secondary | ICD-10-CM

## 2016-09-17 DIAGNOSIS — Z9889 Other specified postprocedural states: Secondary | ICD-10-CM | POA: Diagnosis not present

## 2016-09-17 DIAGNOSIS — M25661 Stiffness of right knee, not elsewhere classified: Secondary | ICD-10-CM | POA: Diagnosis not present

## 2016-09-17 DIAGNOSIS — M25662 Stiffness of left knee, not elsewhere classified: Secondary | ICD-10-CM | POA: Diagnosis not present

## 2016-09-17 NOTE — Therapy (Signed)
[place], [place], [address] Phone: [phone]   Fax:  [phone]  Physical Therapy Treatment  Patient Details  Name: Jeff Edwards. MRN: [id number] Date of Birth: 06/24/1956 Referring Provider: Shelly Coss, MD  Encounter Date: 09/17/2016      PT End of Session - 09/17/16 0856    Visit Number 8   Number of Visits 16   Date for PT Re-Evaluation 10/22/16   PT Start Time 0845   PT Stop Time 0945   PT Time Calculation (min) 60 min      Past Medical History:  Diagnosis Date  . ADHD (attention deficit hyperactivity disorder)   . Anemia    as a child  . 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 Basal cell carcinoma of left forearm   . Diverticulitis   . GERD (gastroesophageal reflux disease)   . High cholesterol   . Hypertension   . Memory change   . Neuropathy ([place])    in feet  . Osteoarthritis of both knees   . Retinal detachment    right eye; "no OR" (07/23/2016)  . Sleep apnea    cpap does not use (study done 10+ yrs) - has lost weight  . Type II diabetes mellitus ([place])     Past Surgical History:  Procedure Laterality Date  . ANKLE FRACTURE SURGERY Right 1980s   broken and reset    . BASAL CELL CARCINOMA EXCISION Left X 2   arm  . COLONOSCOPY    . FRACTURE SURGERY    . JOINT REPLACEMENT    . KNEE ARTHROSCOPY  07/10/2012   Procedure: ARTHROSCOPY KNEE;  Surgeon: Newt Minion, MD;  Location: [facility];  Service: Orthopedics;  Laterality: Right;  Right knee arthroscopy and debridement, chrondralplasty  . KNEE ARTHROSCOPY Left 10/27/2015   Procedure: Left Knee Arthroscopy and Debridement;  Surgeon: Newt Minion, MD;  Location: [facility];  Service: Orthopedics;  Laterality: Left;  . SEPTOPLASTY     x2  . TOTAL KNEE ARTHROPLASTY Left 07/23/2016  . TOTAL KNEE ARTHROPLASTY Left 07/23/2016   Procedure: LEFT TOTAL KNEE ARTHROPLASTY;  Surgeon: Melrose Nakayama, MD;  Location: [facility];  Service: Orthopedics;  Laterality: Left;    There were no vitals filed for this visit.      Subjective Assessment - 09/17/16 0856    Subjective I over did it hiking    Currently in Pain? Yes   Pain Score 3    Pain Location Knee   Pain Orientation Left;Lateral;Medial   Pain Descriptors / Indicators Sharp   Aggravating Factors  hiking   Pain Relieving Factors meds, ice, elevation                         [facility] Adult PT Treatment/Exercise - 09/17/16 0001      Knee/Hip Exercises: Stretches   Active Hamstring Stretch Left;3 reps;30 seconds   Active Hamstring Stretch Limitations using green strip   Knee: Self-Stretch to increase Flexion 3 reps;30 seconds   Knee: Self-Stretch Limitations prone with strap    Other Knee/Hip Stretches slant board      Knee/Hip Exercises: Aerobic   Elliptical L1 Ramp1 x 8 minutes HR 124   Recumbent Bike L3 x 5 minutes     Knee/Hip Exercises: Machines for Strengthening   Cybex Knee Flexion 30# x 20 single leg    Total Gym Leg Press 95# bilateral  Cryotherapy   Number Minutes Cryotherapy 15 Minutes   Cryotherapy Location Knee   Type of Cryotherapy Ice pack     Manual Therapy   Manual therapy comments Knee flexion and exension mobs and flexion mobs in supine                   PT Short Term Goals - 09/03/16 0920      PT SHORT TERM GOAL #1   Title Pt will be I with HEP to improve functional mobility.    Time 4   Period Weeks   Status Achieved     PT SHORT TERM GOAL #2   Title Pt will be able to walk without assistive device for short distances,<100 feet with step through gait pattern.    Time 4   Period Weeks   Status Achieved     PT SHORT TERM GOAL #3   Title Pt will be able to report strategies for RICE, edema mgmt   Time 4   Period Weeks   Status Achieved           PT Long Term Goals - 09/12/16 1256      PT LONG TERM GOAL #1   Title Pt will be I with more advanced HEP for  left knee and  bilateral hips.   Baseline independent with those issued so far   Time 8   Period Weeks   Status On-going     PT LONG TERM GOAL #2   Title Pt will be able to push/pull up to 50# with no increase in knee pain in prep for return to work.    Time 8   Period Weeks   Status Unable to assess     PT LONG TERM GOAL #3   Title Pt will demo 5/5 strength in bilateral LEs for improved function at work.    Time 8   Period Weeks   Status Unable to assess     PT LONG TERM GOAL #4   Title Pt will report no UE assistance needed for standing from low surface   Baseline currently using bilateral UEs for push off from sofa    Time 8   Period Weeks   Status On-going               Plan - 09/17/16 0941    Clinical Impression Statement Pt reports increased pain after hiking this weekend. Focused conditioning stretching, and strengthnening on machines without increased pain. Encouraged pt to stretch and avoid painful exercises for the next couple of day. No additional goals met due to increased pain.    PT Next Visit Plan Pt would like to try stair stepper for hiking prep, progress total knee exercises, patella mobs, wall squats, step exercises.       Patient will benefit from skilled therapeutic intervention in order to improve the following deficits and impairments:  Decreased mobility, Decreased strength, Increased edema, Decreased balance, Decreased activity tolerance, Impaired perceived functional ability, Difficulty walking, Decreased range of motion, Pain  Visit Diagnosis: S/P left knee arthroscopy  Knee stiffness, left  Acute pain of left knee  Difficulty walking  Stiffness of knee joint, right     Problem List Patient Active Problem List   Diagnosis Date Noted  . Primary osteoarthritis of left knee 07/23/2016  . 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 Diabetes ([place]) 03/01/2013    Dorene Ar, PTA 09/17/2016, 9:44 AM  [facility]  [place], [place], [address] Phone: [phone]   Fax:  [phone]  Name: [name] MRN: [id number] Date of Birth: 01/07/1956

## 2016-09-19 ENCOUNTER — Ambulatory Visit: Payer: 59 | Attending: Orthopaedic Surgery | Admitting: Physical Therapy

## 2016-09-19 DIAGNOSIS — Z9889 Other specified postprocedural states: Secondary | ICD-10-CM | POA: Insufficient documentation

## 2016-09-19 DIAGNOSIS — R262 Difficulty in walking, not elsewhere classified: Secondary | ICD-10-CM | POA: Insufficient documentation

## 2016-09-19 DIAGNOSIS — M25662 Stiffness of left knee, not elsewhere classified: Secondary | ICD-10-CM | POA: Insufficient documentation

## 2016-09-19 DIAGNOSIS — M25661 Stiffness of right knee, not elsewhere classified: Secondary | ICD-10-CM | POA: Insufficient documentation

## 2016-09-19 DIAGNOSIS — M25562 Pain in left knee: Secondary | ICD-10-CM | POA: Diagnosis not present

## 2016-09-19 NOTE — Therapy (Signed)
[place], [place], [address] Phone: [phone]   Fax:  [phone]  Physical Therapy Treatment  Patient Details  Name: Jeff Edwards. MRN: [id number] Date of Birth: 05/23/1956 Referring Provider: Shelly Coss, MD  Encounter Date: 09/19/2016      PT End of Session - 09/19/16 0853    Visit Number 9   Number of Visits 16   Date for PT Re-Evaluation 10/22/16   PT Start Time 0845   PT Stop Time 0934   PT Time Calculation (min) 49 min      Past Medical History:  Diagnosis Date  . ADHD (attention deficit hyperactivity disorder)   . Anemia    as a child  . 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 Basal cell carcinoma of left forearm   . Diverticulitis   . GERD (gastroesophageal reflux disease)   . High cholesterol   . Hypertension   . Memory change   . Neuropathy ([place])    in feet  . Osteoarthritis of both knees   . Retinal detachment    right eye; "no OR" (07/23/2016)  . Sleep apnea    cpap does not use (study done 10+ yrs) - has lost weight  . Type II diabetes mellitus ([place])     Past Surgical History:  Procedure Laterality Date  . ANKLE FRACTURE SURGERY Right 1980s   broken and reset    . BASAL CELL CARCINOMA EXCISION Left X 2   arm  . COLONOSCOPY    . FRACTURE SURGERY    . JOINT REPLACEMENT    . KNEE ARTHROSCOPY  07/10/2012   Procedure: ARTHROSCOPY KNEE;  Surgeon: Newt Minion, MD;  Location: [facility];  Service: Orthopedics;  Laterality: Right;  Right knee arthroscopy and debridement, chrondralplasty  . KNEE ARTHROSCOPY Left 10/27/2015   Procedure: Left Knee Arthroscopy and Debridement;  Surgeon: Newt Minion, MD;  Location: [facility];  Service: Orthopedics;  Laterality: Left;  . SEPTOPLASTY     x2  . TOTAL KNEE ARTHROPLASTY Left 07/23/2016  . TOTAL KNEE ARTHROPLASTY Left 07/23/2016   Procedure: LEFT TOTAL KNEE ARTHROPLASTY;  Surgeon: Melrose Nakayama, MD;  Location: [facility];  Service: Orthopedics;  Laterality: Left;    There were no vitals filed for this visit.      Subjective Assessment - 09/19/16 0851    Subjective Can have 5-6/10 sharp pains intermittently. Unsure of reasons.    Currently in Pain? No/denies            [facility] PT Assessment - 09/19/16 0001      Observation/Other Assessments   Focus on Therapeutic Outcomes (FOTO)  74% on eval (Oct 5th), improved to 57% limited today (53% predicted)     AROM   Left Knee Extension 5   Left Knee Flexion 106                     [facility] Adult PT Treatment/Exercise - 09/19/16 0001      Knee/Hip Exercises: Aerobic   Elliptical L1 Ramp1 x 8 minutes    Recumbent Bike L3 x 7 minutes     Knee/Hip Exercises: Standing   Other Standing Knee Exercises sit to stand with LLE back    Other Standing Knee Exercises terminal knee extension green band 10 reps with LLE forward, 10  reps LLE back      Knee/Hip Exercises: Seated   Long Arc Quad Strengthening;Left;20 reps   Long  Arc Quad Weight 8 lbs.     Knee/Hip Exercises: Supine   Quad Sets Left;20 reps   Quad Sets Limitations fair contraction   Straight Leg Raise with External Rotation 15 reps     Manual Therapy   Manual therapy comments patella mobs                   PT Short Term Goals - 09/03/16 0920      PT SHORT TERM GOAL #1   Title Pt will be I with HEP to improve functional mobility.    Time 4   Period Weeks   Status Achieved     PT SHORT TERM GOAL #2   Title Pt will be able to walk without assistive device for short distances,<100 feet with step through gait pattern.    Time 4   Period Weeks   Status Achieved     PT SHORT TERM GOAL #3   Title Pt will be able to report strategies for RICE, edema mgmt   Time 4   Period Weeks   Status Achieved           PT Long Term Goals - 09/19/16 [id number]      PT LONG TERM GOAL #1   Title Pt will be I with more advanced HEP for  left knee and bilateral hips.   Baseline  independent with those issued so far   Time 8   Period Weeks   Status On-going     PT LONG TERM GOAL #2   Title Pt will be able to push/pull up to 50# with no increase in knee pain in prep for return to work.    Time 8   Period Weeks   Status Unable to assess     PT LONG TERM GOAL #3   Title Pt will demo 5/5 strength in bilateral LEs for improved function at work.    Baseline 4 to 5/5   Time 8   Period Weeks   Status Unable to assess     PT LONG TERM GOAL #4   Title Pt will report no UE assistance needed for standing from low surface   Baseline currently using bilateral UEs for push off from sofa    Time 8   Period Weeks   Status On-going     PT LONG TERM GOAL #5   Title Pt will score <45% limited on FOTO to demo improvement and readiness for work.    Baseline FOTO 71%08/22/2016 limitation;  57% 09/19/16   Time 8   Period Weeks   Status On-going               Plan - 09/19/16 [id number]    Clinical Impression Statement Sees MD tomorrow. Reports intermittent sharp pains in lateral knee. He reports this area is numb. FOTO limitation score improved from 74% limited to 57% limited. AROM improved.    PT Next Visit Plan Pt would like to try stair stepper for hiking prep, progress total knee exercises, patella mobs, wall squats, step exercises; BEGIN PUSH/PULL?    PT Home Exercise Plan hamstring stretches, quadsets, SLR, mini squats, wall slides, step ups, SLS, tandem stance, heel raises, hamstring curls with green band, TKE green band, LAQ green band   Consulted and Agree with Plan of Care Patient      Patient will benefit from skilled therapeutic intervention in order to improve the following deficits and impairments:  Decreased mobility, Decreased strength, Increased edema, Decreased balance, Decreased  activity tolerance, Impaired perceived functional ability, Difficulty walking, Decreased range of motion, Pain  Visit Diagnosis: S/P left knee arthroscopy  Knee stiffness,  left  Acute pain of left knee  Difficulty walking  Stiffness of knee joint, right     Problem List Patient Active Problem List   Diagnosis Date Noted  . Primary osteoarthritis of left knee 07/23/2016  . 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 Diabetes ([place]) 03/01/2013    Dorene Ar, PTA 09/19/2016, 9:44 AM  [facility] [address] [place], [place], [address] Phone: [phone]   Fax:  [phone]  Name: Jeff Edwards. MRN: [id number] Date of Birth: 11-03-1956

## 2016-09-20 DIAGNOSIS — M1712 Unilateral primary osteoarthritis, left knee: Secondary | ICD-10-CM | POA: Diagnosis not present

## 2016-09-20 MED FILL — CEPHALEXIN 500 MG CAPSULE: 500 | 5 days supply | Qty: 20 | Fill #0

## 2016-09-23 MED FILL — ATORVASTATIN 20 MG TABLET: 20 | 90 days supply | Qty: 90 | Fill #1

## 2016-09-24 ENCOUNTER — Ambulatory Visit: Payer: 59 | Admitting: Physical Therapy

## 2016-09-24 DIAGNOSIS — M25661 Stiffness of right knee, not elsewhere classified: Secondary | ICD-10-CM

## 2016-09-24 DIAGNOSIS — R262 Difficulty in walking, not elsewhere classified: Secondary | ICD-10-CM | POA: Diagnosis not present

## 2016-09-24 DIAGNOSIS — Z9889 Other specified postprocedural states: Secondary | ICD-10-CM | POA: Diagnosis not present

## 2016-09-24 DIAGNOSIS — M25662 Stiffness of left knee, not elsewhere classified: Secondary | ICD-10-CM | POA: Diagnosis not present

## 2016-09-24 DIAGNOSIS — M25562 Pain in left knee: Secondary | ICD-10-CM | POA: Diagnosis not present

## 2016-09-24 MED FILL — HYDROCODON-APAP 7.5-325: 7.5-325 | 3 days supply | Qty: 30 | Fill #0

## 2016-09-24 NOTE — Therapy (Signed)
[place], [place], [address] Phone: [phone]   Fax:  [phone]  Physical Therapy Treatment  Patient Details  Name: Kalif Kentner. MRN: [id number] Date of Birth: 1955-12-15 Referring Provider: Shelly Coss, MD  Encounter Date: 09/24/2016    Past Medical History:  Diagnosis Date  . ADHD (attention deficit hyperactivity disorder)   . Anemia    as a child  . 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 Basal cell carcinoma of left forearm   . Diverticulitis   . GERD (gastroesophageal reflux disease)   . High cholesterol   . Hypertension   . Memory change   . Neuropathy ([place])    in feet  . Osteoarthritis of both knees   . Retinal detachment    right eye; "no OR" (07/23/2016)  . Sleep apnea    cpap does not use (study done 10+ yrs) - has lost weight  . Type II diabetes mellitus ([place])     Past Surgical History:  Procedure Laterality Date  . ANKLE FRACTURE SURGERY Right 1980s   broken and reset    . BASAL CELL CARCINOMA EXCISION Left X 2   arm  . COLONOSCOPY    . FRACTURE SURGERY    . JOINT REPLACEMENT    . KNEE ARTHROSCOPY  07/10/2012   Procedure: ARTHROSCOPY KNEE;  Surgeon: Newt Minion, MD;  Location: [facility];  Service: Orthopedics;  Laterality: Right;  Right knee arthroscopy and debridement, chrondralplasty  . KNEE ARTHROSCOPY Left 10/27/2015   Procedure: Left Knee Arthroscopy and Debridement;  Surgeon: Newt Minion, MD;  Location: [facility];  Service: Orthopedics;  Laterality: Left;  . SEPTOPLASTY     x2  . TOTAL KNEE ARTHROPLASTY Left 07/23/2016  . TOTAL KNEE ARTHROPLASTY Left 07/23/2016   Procedure: LEFT TOTAL KNEE ARTHROPLASTY;  Surgeon: Melrose Nakayama, MD;  Location: [facility];  Service: Orthopedics;  Laterality: Left;    There were no vitals filed for this visit.      Subjective Assessment - 09/24/16 0852    Subjective "had another visit from the MD on Friday and he  reported the sharp pains are normal and may continue for perhaps a year"   Currently in Pain? Yes   Pain Score 2    Pain Location Knee   Pain Orientation Left;Lateral;Medial   Pain Type Surgical pain   Pain Onset More than a month ago   Pain Frequency Constant   Aggravating Factors  hiking, power walking   Pain Relieving Factors meds, ice, elevation            [facility] PT Assessment - 09/24/16 0001      AROM   Left Knee Extension -5   Left Knee Flexion 105                     [facility] Adult PT Treatment/Exercise - 09/24/16 0854      Knee/Hip Exercises: Stretches   Active Hamstring Stretch 2 reps;30 seconds   Knee: Self-Stretch to increase Flexion 30 seconds;3 reps     Knee/Hip Exercises: Aerobic   Elliptical --   Recumbent Bike L4 x 6 minutes  lowering seat every 2 min     Knee/Hip Exercises: Standing   Step Down 2 sets;Step Height: 6"  with TKE   Gait Training resisted walking forward/ backward 17# x 10 each     Manual Therapy   Manual therapy comments patella mobs grade 3 superior/ inferior  PT Short Term Goals - 09/03/16 0920      PT SHORT TERM GOAL #1   Title Pt will be I with HEP to improve functional mobility.    Time 4   Period Weeks   Status Achieved     PT SHORT TERM GOAL #2   Title Pt will be able to walk without assistive device for short distances,<100 feet with step through gait pattern.    Time 4   Period Weeks   Status Achieved     PT SHORT TERM GOAL #3   Title Pt will be able to report strategies for RICE, edema mgmt   Time 4   Period Weeks   Status Achieved           PT Long Term Goals - 09/19/16 [id number]      PT LONG TERM GOAL #1   Title Pt will be I with more advanced HEP for  left knee and bilateral hips.   Baseline independent with those issued so far   Time 8   Period Weeks   Status On-going     PT LONG TERM GOAL #2   Title Pt will be able to push/pull up to 50# with no increase in knee  pain in prep for return to work.    Time 8   Period Weeks   Status Unable to assess     PT LONG TERM GOAL #3   Title Pt will demo 5/5 strength in bilateral LEs for improved function at work.    Baseline 4 to 5/5   Time 8   Period Weeks   Status Unable to assess     PT LONG TERM GOAL #4   Title Pt will report no UE assistance needed for standing from low surface   Baseline currently using bilateral UEs for push off from sofa    Time 8   Period Weeks   Status On-going     PT LONG TERM GOAL #5   Title Pt will score <45% limited on FOTO to demo improvement and readiness for work.    Baseline FOTO 71%08/22/2016 limitation;  57% 09/19/16   Time 8   Period Weeks   Status On-going               Plan - 09/24/16 1017    Clinical Impression Statement Mr. Looney saw his MD and reported that the pain may occur for a yar and if it starts for him to limit his activity until it goes away. continued working on patellar mobility and resisted walking and TKE with step downs. he declined modalities post session .    PT Next Visit Plan Pt would like to try stair stepper for hiking prep, progress total knee exercises, patella mobs, wall squats, step exercises; pushing/ pulling, resisted walking,    Consulted and Agree with Plan of Care Patient      Patient will benefit from skilled therapeutic intervention in order to improve the following deficits and impairments:     Visit Diagnosis: S/P left knee arthroscopy  Knee stiffness, left  Acute pain of left knee  Difficulty walking  Stiffness of knee joint, right     Problem List Patient Active Problem List   Diagnosis Date Noted  . Primary osteoarthritis of left knee 07/23/2016  . 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 Diabetes ([place]) 03/01/2013   Lubna Stegeman PT, DPT, LAT, ATC  09/24/16  10:19 AM      [facility] [address] [place],  [place], [address] Phone: [phone]   Fax:  [phone]  Name: [name] MRN: [id number] Date of Birth: 1956-11-16

## 2016-09-26 ENCOUNTER — Ambulatory Visit: Payer: 59

## 2016-09-26 DIAGNOSIS — M25562 Pain in left knee: Secondary | ICD-10-CM | POA: Diagnosis not present

## 2016-09-26 DIAGNOSIS — R262 Difficulty in walking, not elsewhere classified: Secondary | ICD-10-CM

## 2016-09-26 DIAGNOSIS — M25662 Stiffness of left knee, not elsewhere classified: Secondary | ICD-10-CM | POA: Diagnosis not present

## 2016-09-26 DIAGNOSIS — Z9889 Other specified postprocedural states: Secondary | ICD-10-CM

## 2016-09-26 DIAGNOSIS — M25661 Stiffness of right knee, not elsewhere classified: Secondary | ICD-10-CM | POA: Diagnosis not present

## 2016-09-26 NOTE — Therapy (Signed)
[place], [place], [address] Phone: [phone]   Fax:  [phone]  Physical Therapy Treatment  Patient Details  Name: Jeff Edwards. MRN: [id number] Date of Birth: 09-Aug-1956 Referring Provider: Shelly Coss, MD  Encounter Date: 09/26/2016      PT End of Session - 09/26/16 1148    Visit Number 10   Number of Visits 16   Date for PT Re-Evaluation 10/22/16   PT Start Time 6606   PT Stop Time 1230   PT Time Calculation (min) 45 min   Activity Tolerance Patient tolerated treatment well   Behavior During Therapy [facility] for tasks assessed/performed      Past Medical History:  Diagnosis Date  . ADHD (attention deficit hyperactivity disorder)   . Anemia    as a child  . 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 Basal cell carcinoma of left forearm   . Diverticulitis   . GERD (gastroesophageal reflux disease)   . High cholesterol   . Hypertension   . Memory change   . Neuropathy ([place])    in feet  . Osteoarthritis of both knees   . Retinal detachment    right eye; "no OR" (07/23/2016)  . Sleep apnea    cpap does not use (study done 10+ yrs) - has lost weight  . Type II diabetes mellitus ([place])     Past Surgical History:  Procedure Laterality Date  . ANKLE FRACTURE SURGERY Right 1980s   broken and reset    . BASAL CELL CARCINOMA EXCISION Left X 2   arm  . COLONOSCOPY    . FRACTURE SURGERY    . JOINT REPLACEMENT    . KNEE ARTHROSCOPY  07/10/2012   Procedure: ARTHROSCOPY KNEE;  Surgeon: Newt Minion, MD;  Location: [place];  Service: Orthopedics;  Laterality: Right;  Right knee arthroscopy and debridement, chrondralplasty  . KNEE ARTHROSCOPY Left 10/27/2015   Procedure: Left Knee Arthroscopy and Debridement;  Surgeon: Newt Minion, MD;  Location: [place];  Service: Orthopedics;  Laterality: Left;  . SEPTOPLASTY     x2  . TOTAL KNEE ARTHROPLASTY Left 07/23/2016  . TOTAL KNEE  ARTHROPLASTY Left 07/23/2016   Procedure: LEFT TOTAL KNEE ARTHROPLASTY;  Surgeon: Melrose Nakayama, MD;  Location: [place];  Service: Orthopedics;  Laterality: Left;    There were no vitals filed for this visit.      Subjective Assessment - 09/26/16 1148    Subjective Mild pain 1-2/10 location depends on stress to knee   Currently in Pain? Yes   Pain Score 2    Pain Location Knee   Pain Orientation Left;Anterior;Lateral;Medial   Pain Descriptors / Indicators Sharp   Pain Type Surgical pain   Pain Onset More than a month ago   Pain Frequency Constant   Aggravating Factors  walking fast   Pain Relieving Factors meds ice elevation   Multiple Pain Sites No                         [facility] Adult PT Treatment/Exercise - 09/26/16 1150      Knee/Hip Exercises: Aerobic   Recumbent Bike L2 6 min with 2 min lower seat for ROM   Stepper L3  3 min short steps      Knee/Hip Exercises: Standing   Step Down Left;2 sets;15 reps;Hand Hold: 1;Step Height: 8"   Step Down Limitations step down 4 inches  Wall Squat 15 reps   Gait Training resisted walking forward and back  4 plates x 6 each way.    Other Standing Knee Exercises sit to stand with LLE back x12   Other Standing Knee Exercises Tai chi based foot work with forward/back and side steps with trying to keep weight on stance leg.  Demo by PT                PT Education - 09/26/16 1238    Education provided Yes   Education Details tai chi steps forward and lateral basically keeping weight on stance leg and foot placement before weight shift   Person(s) Educated Patient   Methods Explanation;Demonstration;Verbal cues   Comprehension Verbalized understanding;Returned demonstration          PT Short Term Goals - 09/03/16 0920      PT SHORT TERM GOAL #1   Title Pt will be I with HEP to improve functional mobility.    Time 4   Period Weeks   Status Achieved     PT SHORT TERM GOAL #2   Title Pt will be able to  walk without assistive device for short distances,<100 feet with step through gait pattern.    Time 4   Period Weeks   Status Achieved     PT SHORT TERM GOAL #3   Title Pt will be able to report strategies for RICE, edema mgmt   Time 4   Period Weeks   Status Achieved           PT Long Term Goals - 09/19/16 6333      PT LONG TERM GOAL #1   Title Pt will be I with more advanced HEP for  left knee and bilateral hips.   Baseline independent with those issued so far   Time 8   Period Weeks   Status On-going     PT LONG TERM GOAL #2   Title Pt will be able to push/pull up to 50# with no increase in knee pain in prep for return to work.    Time 8   Period Weeks   Status Unable to assess     PT LONG TERM GOAL #3   Title Pt will demo 5/5 strength in bilateral LEs for improved function at work.    Baseline 4 to 5/5   Time 8   Period Weeks   Status Unable to assess     PT LONG TERM GOAL #4   Title Pt will report no UE assistance needed for standing from low surface   Baseline currently using bilateral UEs for push off from sofa    Time 8   Period Weeks   Status On-going     PT LONG TERM GOAL #5   Title Pt will score <45% limited on FOTO to demo improvement and readiness for work.    Baseline FOTO 71%08/22/2016 limitation;  57% 09/19/16   Time 8   Period Weeks   Status On-going               Plan - 09/26/16 1239    Clinical Impression Statement He is doing very well  limited by pain  with more loaded activity eccentric . Functionally he is doing well as he has hiked last weekend   PT Treatment/Interventions ADLs/Self Care Home Management;Cryotherapy;Electrical Stimulation;Iontophoresis 41m/ml Dexamethasone;Passive range of motion;Vasopneumatic Device;Manual techniques;Taping;Therapeutic activities;Therapeutic exercise;Patient/family education;Functional mobility training;Stair training;Gait training   PT Next Visit Plan Continue strength and balance , mobs  and ROM,  stepper was tolerated, resisted walking   PT Home Exercise Plan hamstring stretches, quadsets, SLR, mini squats, wall slides, step ups, SLS, tandem stance, heel raises, hamstring curls with green band, TKE green band, LAQ green band, weight bearing with controlled stepping   Consulted and Agree with Plan of Care Patient      Patient will benefit from skilled therapeutic intervention in order to improve the following deficits and impairments:  Decreased mobility, Decreased strength, Increased edema, Decreased balance, Decreased activity tolerance, Impaired perceived functional ability, Difficulty walking, Decreased range of motion, Pain  Visit Diagnosis: S/P left knee arthroscopy  Knee stiffness, left  Acute pain of left knee  Difficulty walking     Problem List Patient Active Problem List   Diagnosis Date Noted  . Primary osteoarthritis of left knee 07/23/2016  . 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 Diabetes ([place]) 03/01/2013    Darrel Hoover  PT 09/26/2016, 12:43 PM  [facility] [address] [place], [place], [address] Phone: [phone]   Fax:  [phone]  Name: [name] MRN: [id number] Date of Birth: [birth year]

## 2016-10-01 ENCOUNTER — Ambulatory Visit: Payer: 59 | Admitting: Physical Therapy

## 2016-10-01 DIAGNOSIS — M25661 Stiffness of right knee, not elsewhere classified: Secondary | ICD-10-CM

## 2016-10-01 DIAGNOSIS — M25662 Stiffness of left knee, not elsewhere classified: Secondary | ICD-10-CM | POA: Diagnosis not present

## 2016-10-01 DIAGNOSIS — Z9889 Other specified postprocedural states: Secondary | ICD-10-CM | POA: Diagnosis not present

## 2016-10-01 DIAGNOSIS — M25562 Pain in left knee: Secondary | ICD-10-CM | POA: Diagnosis not present

## 2016-10-01 DIAGNOSIS — R262 Difficulty in walking, not elsewhere classified: Secondary | ICD-10-CM | POA: Diagnosis not present

## 2016-10-01 NOTE — Therapy (Signed)
[place], [place], [address] Phone: [phone]   Fax:  [phone]  Physical Therapy Treatment  Patient Details  Name: Jeff Edwards. MRN: [id number] Date of Birth: 22-Feb-1956 Referring Provider: Shelly Coss, MD  Encounter Date: 10/01/2016      PT End of Session - 10/01/16 0853    Visit Number 11   Number of Visits 16   Date for PT Re-Evaluation 10/22/16   PT Start Time 0852   PT Stop Time 0930   PT Time Calculation (min) 38 min      Past Medical History:  Diagnosis Date  . ADHD (attention deficit hyperactivity disorder)   . Anemia    as a child  . 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 Basal cell carcinoma of left forearm   . Diverticulitis   . GERD (gastroesophageal reflux disease)   . High cholesterol   . Hypertension   . Memory change   . Neuropathy ([place])    in feet  . Osteoarthritis of both knees   . Retinal detachment    right eye; "no OR" (07/23/2016)  . Sleep apnea    cpap does not use (study done 10+ yrs) - has lost weight  . Type II diabetes mellitus ([place])     Past Surgical History:  Procedure Laterality Date  . ANKLE FRACTURE SURGERY Right 1980s   broken and reset    . BASAL CELL CARCINOMA EXCISION Left X 2   arm  . COLONOSCOPY    . FRACTURE SURGERY    . JOINT REPLACEMENT    . KNEE ARTHROSCOPY  07/10/2012   Procedure: ARTHROSCOPY KNEE;  Surgeon: Newt Minion, MD;  Location: [place];  Service: Orthopedics;  Laterality: Right;  Right knee arthroscopy and debridement, chrondralplasty  . KNEE ARTHROSCOPY Left 10/27/2015   Procedure: Left Knee Arthroscopy and Debridement;  Surgeon: Newt Minion, MD;  Location: [place];  Service: Orthopedics;  Laterality: Left;  . SEPTOPLASTY     x2  . TOTAL KNEE ARTHROPLASTY Left 07/23/2016  . TOTAL KNEE ARTHROPLASTY Left 07/23/2016   Procedure: LEFT TOTAL KNEE ARTHROPLASTY;  Surgeon: Melrose Nakayama, MD;  Location:  [place];  Service: Orthopedics;  Laterality: Left;    There were no vitals filed for this visit.                       [place] Adult PT Treatment/Exercise - 10/01/16 0001      Knee/Hip Exercises: Aerobic   Recumbent Bike L4 x 7 minutes, lowering seat for ROM   Stepper L3  4 min short steps      Knee/Hip Exercises: Machines for Strengthening   Cybex Knee Flexion 25# x 20 single    Total Gym Leg Press 95# bilateral, 35# single left leg x 20      Knee/Hip Exercises: Standing   Lateral Step Up 10 reps;Step Height: 4"   Step Down Left;2 sets;15 reps;Hand Hold: 1;Step Height: 8"   Step Down Limitations step down 4 inches   Rebounder multiple trials, unable to SLS while tossing ball, used Bosu for toe touch from RLE    Gait Training resisted walking forward and back  4 plates x 6 each way.                   PT Short Term Goals - 09/03/16 0920      PT SHORT TERM GOAL #1   Title  Pt will be I with HEP to improve functional mobility.    Time 4   Period Weeks   Status Achieved     PT SHORT TERM GOAL #2   Title Pt will be able to walk without assistive device for short distances,<100 feet with step through gait pattern.    Time 4   Period Weeks   Status Achieved     PT SHORT TERM GOAL #3   Title Pt will be able to report strategies for RICE, edema mgmt   Time 4   Period Weeks   Status Achieved           PT Long Term Goals - 10/01/16 1002      PT LONG TERM GOAL #1   Title Pt will be I with more advanced HEP for  left knee and bilateral hips.   Baseline independent with those issued so far   Time 8   Period Weeks   Status On-going     PT LONG TERM GOAL #2   Title Pt will be able to push/pull up to 50# with no increase in knee pain in prep for return to work.    Time 8   Period Weeks   Status Unable to assess     PT LONG TERM GOAL #3   Title Pt will demo 5/5 strength in bilateral LEs for improved function at work.    Baseline 4 to 5/5 last  tested   Time 8   Period Weeks   Status Unable to assess     PT LONG TERM GOAL #4   Title Pt will report no UE assistance needed for standing from low surface   Baseline forgets to scoot to edge of sofa   Time 8   Period Weeks   Status On-going     PT LONG TERM GOAL #5   Title Pt will score <45% limited on FOTO to demo improvement and readiness for work.    Baseline FOTO 71%08/22/2016 limitation;  57% 09/19/16   Time 8   Period Weeks   Status On-going               Plan - 10/01/16 0957    Clinical Impression Statement Continued balance and strengthening in closed chain. He tolerated more time on the stair stepper. He requires UE assist still to rise from his sofa. He reports he forgets to scoot to the edge of the sofa so he pulls up from the coffee table. Encouraged pt to try to remember to scoot to edge of mat.    PT Next Visit Plan Remeasure ROM- Continue strength and balance , mobs and ROM, stepper was tolerated, resisted walking   PT Home Exercise Plan hamstring stretches, quadsets, SLR, mini squats, wall slides, step ups, SLS, tandem stance, heel raises, hamstring curls with green band, TKE green band, LAQ green band, weight bearing with controlled stepping      Patient will benefit from skilled therapeutic intervention in order to improve the following deficits and impairments:  Decreased mobility, Decreased strength, Increased edema, Decreased balance, Decreased activity tolerance, Impaired perceived functional ability, Difficulty walking, Decreased range of motion, Pain  Visit Diagnosis: S/P left knee arthroscopy  Knee stiffness, left  Acute pain of left knee  Difficulty walking  Stiffness of knee joint, right     Problem List Patient Active Problem List   Diagnosis Date Noted  . Primary osteoarthritis of left knee 07/23/2016  . 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  Diabetes ([place]) 03/01/2013    Dorene Ar,  PTA 10/01/2016, 10:13 AM  [facility] [address] [place], [place], [address] Phone: [phone]   Fax:  [phone]  Name: Jeff Edwards. MRN: [id number] Date of Birth: [birth year]

## 2016-10-03 ENCOUNTER — Ambulatory Visit: Payer: 59

## 2016-10-03 DIAGNOSIS — R262 Difficulty in walking, not elsewhere classified: Secondary | ICD-10-CM | POA: Diagnosis not present

## 2016-10-03 DIAGNOSIS — Z9889 Other specified postprocedural states: Secondary | ICD-10-CM | POA: Diagnosis not present

## 2016-10-03 DIAGNOSIS — M25661 Stiffness of right knee, not elsewhere classified: Secondary | ICD-10-CM | POA: Diagnosis not present

## 2016-10-03 DIAGNOSIS — M25562 Pain in left knee: Secondary | ICD-10-CM | POA: Diagnosis not present

## 2016-10-03 DIAGNOSIS — M25662 Stiffness of left knee, not elsewhere classified: Secondary | ICD-10-CM | POA: Diagnosis not present

## 2016-10-03 NOTE — Therapy (Signed)
[place], [place], [address] Phone: [phone]   Fax:  [phone]  Physical Therapy Treatment  Patient Details  Name: Jeff Edwards. MRN: [id number] Date of Birth: 03-02-1956 Referring Provider: Shelly Coss, MD  Encounter Date: 10/03/2016      PT End of Session - 10/03/16 1149    Visit Number 12   Number of Visits 16   Date for PT Re-Evaluation 10/22/16   PT Start Time 2263   PT Stop Time 1230   PT Time Calculation (min) 45 min   Activity Tolerance Patient tolerated treatment well   Behavior During Therapy [facility] for tasks assessed/performed      Past Medical History:  Diagnosis Date  . ADHD (attention deficit hyperactivity disorder)   . Anemia    as a child  . 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 Basal cell carcinoma of left forearm   . Diverticulitis   . GERD (gastroesophageal reflux disease)   . High cholesterol   . Hypertension   . Memory change   . Neuropathy ([place])    in feet  . Osteoarthritis of both knees   . Retinal detachment    right eye; "no OR" (07/23/2016)  . Sleep apnea    cpap does not use (study done 10+ yrs) - has lost weight  . Type II diabetes mellitus ([place])     Past Surgical History:  Procedure Laterality Date  . ANKLE FRACTURE SURGERY Right 1980s   broken and reset    . BASAL CELL CARCINOMA EXCISION Left X 2   arm  . COLONOSCOPY    . FRACTURE SURGERY    . JOINT REPLACEMENT    . KNEE ARTHROSCOPY  07/10/2012   Procedure: ARTHROSCOPY KNEE;  Surgeon: Newt Minion, MD;  Location: [place];  Service: Orthopedics;  Laterality: Right;  Right knee arthroscopy and debridement, chrondralplasty  . KNEE ARTHROSCOPY Left 10/27/2015   Procedure: Left Knee Arthroscopy and Debridement;  Surgeon: Newt Minion, MD;  Location: [place];  Service: Orthopedics;  Laterality: Left;  . SEPTOPLASTY     x2  . TOTAL KNEE ARTHROPLASTY Left 07/23/2016  . TOTAL  KNEE ARTHROPLASTY Left 07/23/2016   Procedure: LEFT TOTAL KNEE ARTHROPLASTY;  Surgeon: Melrose Nakayama, MD;  Location: [place];  Service: Orthopedics;  Laterality: Left;    There were no vitals filed for this visit.      Subjective Assessment - 10/03/16 1150    Subjective Knee sore due to raking leaves this morning.  Feels ths is as much muscular as knee pain. Can sit for and hour.    Currently in Pain? Yes   Pain Score 3    Pain Location Knee   Pain Orientation Left;Anterior   Pain Descriptors / Indicators Sore   Pain Type Surgical pain   Pain Onset More than a month ago   Pain Frequency Constant   Aggravating Factors  walking / lateral stress to knee   Pain Relieving Factors meds ice elevation   Multiple Pain Sites No            [facility] PT Assessment - 10/03/16 0001      AROM   Left Knee Extension -5   Left Knee Flexion 110     PROM   Left Knee Extension -2-3    Left Knee Flexion 114     Strength   Left Hip Flexion 4/5   Left Hip Extension 4+/5   Left  Hip ABduction 4+/5   Left Hip ADduction --  5-/5   Left Knee Flexion 5/5   Left Knee Extension --  5-/5                     [facility] Adult PT Treatment/Exercise - 10/03/16 0001      Therapeutic Activites    Therapeutic Activities Work Simulation   Work Simulation 75 feet  each x7 60 pounds on sled  push and pull sled 45 pound force.      Knee/Hip Exercises: Aerobic   Recumbent Bike L2-4 x 8 minutes, lowering seat for ROM   Stepper L4 3 min     Knee/Hip Exercises: Machines for Strengthening   Total Gym Leg Press --                  PT Short Term Goals - 09/03/16 0920      PT SHORT TERM GOAL #1   Title Pt will be I with HEP to improve functional mobility.    Time 4   Period Weeks   Status Achieved     PT SHORT TERM GOAL #2   Title Pt will be able to walk without assistive device for short distances,<100 feet with step through gait pattern.    Time 4   Period Weeks   Status Achieved      PT SHORT TERM GOAL #3   Title Pt will be able to report strategies for RICE, edema mgmt   Time 4   Period Weeks   Status Achieved           PT Long Term Goals - 10/03/16 1204      PT LONG TERM GOAL #1   Title Pt will be I with more advanced HEP for  left knee and bilateral hips.   Status On-going     PT LONG TERM GOAL #3   Title Pt will demo 5/5 strength in bilateral LEs for improved function at work.    Baseline 4/5 hip flexion up to 5-/5 LT quads and hip adduction   Status Partially Met     PT LONG TERM GOAL #4   Title Pt will report no UE assistance needed for standing from low surface   Baseline forgets to scoot to edge of sofa   Status On-going     PT LONG TERM GOAL #5   Title Pt will score <45% limited on FOTO to demo improvement and readiness for work.    Baseline FOTO 71%08/22/2016 limitation;  57% 09/19/16   Status On-going               Plan - 10/03/16 1149    Clinical Impression Statement Mr Pompey si doing well but more strenupus activity such as hikingand raking leaves causes pain/soreness in knee.    PT Treatment/Interventions ADLs/Self Care Home Management;Cryotherapy;Electrical Stimulation;Iontophoresis 86m/ml Dexamethasone;Passive range of motion;Vasopneumatic Device;Manual techniques;Taping;Therapeutic activities;Therapeutic exercise;Patient/family education;Functional mobility training;Stair training;Gait training   PT Next Visit Plan Remeasure ROM- Continue strength and balance , mobs and ROM, stepper was tolerated, resisted walking   PT Home Exercise Plan hamstring stretches, quadsets, SLR, mini squats, wall slides, step ups, SLS, tandem stance, heel raises, hamstring curls with green band, TKE green band, LAQ green band, weight bearing with controlled stepping   Consulted and Agree with Plan of Care Patient      Patient will benefit from skilled therapeutic intervention in order to improve the following deficits and impairments:  Decreased  mobility, Decreased  strength, Increased edema, Decreased balance, Decreased activity tolerance, Impaired perceived functional ability, Difficulty walking, Decreased range of motion, Pain  Visit Diagnosis: S/P left knee arthroscopy  Knee stiffness, left  Acute pain of left knee  Difficulty walking  Stiffness of knee joint, right     Problem List Patient Active Problem List   Diagnosis Date Noted  . Primary osteoarthritis of left knee 07/23/2016  . 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 Diabetes ([place]) 03/01/2013    Darrel Hoover  PT 10/03/2016, 12:36 PM  [facility] [address] [place], [place], [address] Phone: [phone]   Fax:  [phone]  Name: [name] MRN: [id number] Date of Birth: 1956-07-31

## 2016-10-04 MED FILL — metFORMIN HCL 500 MG TABS: 500 | 90 days supply | Qty: 360 | Fill #0 | Status: TO

## 2016-10-08 ENCOUNTER — Ambulatory Visit: Payer: 59 | Admitting: Physical Therapy

## 2016-10-08 DIAGNOSIS — Z9889 Other specified postprocedural states: Secondary | ICD-10-CM

## 2016-10-08 DIAGNOSIS — M25562 Pain in left knee: Secondary | ICD-10-CM | POA: Diagnosis not present

## 2016-10-08 DIAGNOSIS — R262 Difficulty in walking, not elsewhere classified: Secondary | ICD-10-CM | POA: Diagnosis not present

## 2016-10-08 DIAGNOSIS — M25661 Stiffness of right knee, not elsewhere classified: Secondary | ICD-10-CM | POA: Diagnosis not present

## 2016-10-08 DIAGNOSIS — M25662 Stiffness of left knee, not elsewhere classified: Secondary | ICD-10-CM | POA: Diagnosis not present

## 2016-10-08 MED FILL — ASPIR-LOW EC 81 MG TABLET: 81 | 90 days supply | Qty: 90 | Fill #1

## 2016-10-08 NOTE — Therapy (Signed)
[place], [place], [address] Phone: [phone]   Fax:  [phone]  Physical Therapy Treatment  Patient Details  Name: Zakarie Sturdivant. MRN: [id number] Date of Birth: 07/03/1956 Referring Provider: Shelly Coss, MD  Encounter Date: 10/08/2016      PT End of Session - 10/08/16 0903    Visit Number 13   Number of Visits 16   Date for PT Re-Evaluation 10/22/16   PT Start Time 0845   PT Stop Time 0926   PT Time Calculation (min) 41 min      Past Medical History:  Diagnosis Date  . ADHD (attention deficit hyperactivity disorder)   . Anemia    as a child  . 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 Basal cell carcinoma of left forearm   . Diverticulitis   . GERD (gastroesophageal reflux disease)   . High cholesterol   . Hypertension   . Memory change   . Neuropathy ([place])    in feet  . Osteoarthritis of both knees   . Retinal detachment    right eye; "no OR" (07/23/2016)  . Sleep apnea    cpap does not use (study done 10+ yrs) - has lost weight  . Type II diabetes mellitus ([place])     Past Surgical History:  Procedure Laterality Date  . ANKLE FRACTURE SURGERY Right 1980s   broken and reset    . BASAL CELL CARCINOMA EXCISION Left X 2   arm  . COLONOSCOPY    . FRACTURE SURGERY    . JOINT REPLACEMENT    . KNEE ARTHROSCOPY  07/10/2012   Procedure: ARTHROSCOPY KNEE;  Surgeon: Newt Minion, MD;  Location: [place];  Service: Orthopedics;  Laterality: Right;  Right knee arthroscopy and debridement, chrondralplasty  . KNEE ARTHROSCOPY Left 10/27/2015   Procedure: Left Knee Arthroscopy and Debridement;  Surgeon: Newt Minion, MD;  Location: [place];  Service: Orthopedics;  Laterality: Left;  . SEPTOPLASTY     x2  . TOTAL KNEE ARTHROPLASTY Left 07/23/2016  . TOTAL KNEE ARTHROPLASTY Left 07/23/2016   Procedure: LEFT TOTAL KNEE ARTHROPLASTY;  Surgeon: Melrose Nakayama, MD;  Location:  [place];  Service: Orthopedics;  Laterality: Left;    There were no vitals filed for this visit.      Subjective Assessment - 10/08/16 0902    Subjective Average pain at end of day is 4/10    Currently in Pain? Yes   Pain Score 2    Pain Location Knee   Pain Orientation Left;Anterior                         [facility] Adult PT Treatment/Exercise - 10/08/16 0001      Knee/Hip Exercises: Stretches   Active Hamstring Stretch 3 reps;30 seconds   Knee: Self-Stretch to increase Flexion 30 seconds;3 reps   Knee: Self-Stretch Limitations prone     Knee/Hip Exercises: Aerobic   Recumbent Bike L3 x 6 minutes   Stepper L3 x 5 minutes     Knee/Hip Exercises: Standing   Gait Training resisted walking forward and back  4 plates x 6 each way.    Other Standing Knee Exercises 4 way hip with SLS x 10 each way bilateral- cues for posture and core engagement    Other Standing Knee Exercises tai chi  stepping forward and lateral 10 x 2 each with cues to keep weight on stance leg.  PT Short Term Goals - 09/03/16 0920      PT SHORT TERM GOAL #1   Title Pt will be I with HEP to improve functional mobility.    Time 4   Period Weeks   Status Achieved     PT SHORT TERM GOAL #2   Title Pt will be able to walk without assistive device for short distances,<100 feet with step through gait pattern.    Time 4   Period Weeks   Status Achieved     PT SHORT TERM GOAL #3   Title Pt will be able to report strategies for RICE, edema mgmt   Time 4   Period Weeks   Status Achieved           PT Long Term Goals - 10/03/16 1204      PT LONG TERM GOAL #1   Title Pt will be I with more advanced HEP for  left knee and bilateral hips.   Status On-going     PT LONG TERM GOAL #2   Title Pt will be able to push/pull up to 50# with no increase in knee pain in prep for return to work.    Baseline Able to push but not pull without pain was able to piush and pull 45  pounds comfortably    Status Partially Met     PT LONG TERM GOAL #3   Title Pt will demo 5/5 strength in bilateral LEs for improved function at work.    Baseline 4/5 hip flexion up to 5-/5 LT quads and hip adduction   Status Partially Met     PT LONG TERM GOAL #4   Title Pt will report no UE assistance needed for standing from low surface   Baseline forgets to scoot to edge of sofa   Status On-going     PT LONG TERM GOAL #5   Title Pt will score <45% limited on FOTO to demo improvement and readiness for work.    Baseline FOTO 71%08/22/2016 limitation;  57% 09/19/16   Status On-going               Plan - 10/08/16 0949    Clinical Impression Statement Pt with difficulty in SLS exercises. Unable to maintain balance on left with static or dynamic exercsies. Began Standing 4 way hip with red band around right ankle to challenge SLS on left. Frequent LOB requiring UE assist to stabilize. Added to HEP. Pt declined modalities.    PT Next Visit Plan Continue strength and balance , mobs and ROM, stepper as tolerated, resisted walking, 4 way hip with theraband    PT Home Exercise Plan hamstring stretches, quadsets, SLR, mini squats, wall slides, step ups, SLS, tandem stance, heel raises, hamstring curls with green band, TKE green band, LAQ green band, weight bearing with controlled stepping, 4 way hip with theraband        Patient will benefit from skilled therapeutic intervention in order to improve the following deficits and impairments:  Decreased mobility, Decreased strength, Increased edema, Decreased balance, Decreased activity tolerance, Impaired perceived functional ability, Difficulty walking, Decreased range of motion, Pain  Visit Diagnosis: S/P left knee arthroscopy  Knee stiffness, left  Acute pain of left knee  Difficulty walking  Stiffness of knee joint, right     Problem List Patient Active Problem List   Diagnosis Date Noted  . Primary osteoarthritis of left  knee 07/23/2016  . 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  Diabetes ([place]) 03/01/2013    Dorene Ar, PTA 10/08/2016, 9:53 AM  [facility] [address] [place], [place], [address] Phone: [phone]   Fax:  [phone]  Name: [name] MRN: [id number] Date of Birth: 10-Feb-1956

## 2016-10-15 ENCOUNTER — Ambulatory Visit: Payer: 59 | Admitting: Physical Therapy

## 2016-10-17 ENCOUNTER — Ambulatory Visit: Payer: 59

## 2016-10-17 DIAGNOSIS — M25562 Pain in left knee: Secondary | ICD-10-CM | POA: Diagnosis not present

## 2016-10-17 DIAGNOSIS — M25661 Stiffness of right knee, not elsewhere classified: Secondary | ICD-10-CM | POA: Diagnosis not present

## 2016-10-17 DIAGNOSIS — R262 Difficulty in walking, not elsewhere classified: Secondary | ICD-10-CM | POA: Diagnosis not present

## 2016-10-17 DIAGNOSIS — Z9889 Other specified postprocedural states: Secondary | ICD-10-CM

## 2016-10-17 DIAGNOSIS — M25662 Stiffness of left knee, not elsewhere classified: Secondary | ICD-10-CM

## 2016-10-17 NOTE — Therapy (Signed)
[facility] [address] [place], [place], [address] Phone: [phone]   Fax:  [phone]  Physical Therapy Treatment  Patient Details  Name: Jeff Edwards. MRN: [id number] Date of Birth: 1956-04-17 Referring Provider: Drucie Ip, MD  Encounter Date: 10/17/2016      PT End of Session - 10/17/16 1240    Visit Number 14   Number of Visits 16   Date for PT Re-Evaluation 10/22/16   PT Start Time 1232   PT Stop Time 1325   PT Time Calculation (min) 53 min   Activity Tolerance Patient tolerated treatment well   Behavior During Therapy [facility] for tasks assessed/performed      Past Medical History:  Diagnosis Date  . ADHD (attention deficit hyperactivity disorder)   . Anemia    as a child  . 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 Basal cell carcinoma of left forearm   . Diverticulitis   . GERD (gastroesophageal reflux disease)   . High cholesterol   . Hypertension   . Memory change   . Neuropathy ([facility])    in feet  . Osteoarthritis of both knees   . Retinal detachment    right eye; "no OR" (07/23/2016)  . Sleep apnea    cpap does not use (study done 10+ yrs) - has lost weight  . Type II diabetes mellitus ([facility])     Past Surgical History:  Procedure Laterality Date  . ANKLE FRACTURE SURGERY Right 1980s   broken and reset    . BASAL CELL CARCINOMA EXCISION Left X 2   arm  . COLONOSCOPY    . FRACTURE SURGERY    . JOINT REPLACEMENT    . KNEE ARTHROSCOPY  07/10/2012   Procedure: ARTHROSCOPY KNEE;  Surgeon: Nadara Mustard, MD;  [facility];  Service: Orthopedics;  Laterality: Right;  Right knee arthroscopy and debridement, chrondralplasty  . KNEE ARTHROSCOPY Left 10/27/2015   Procedure: Left Knee Arthroscopy and Debridement;  Surgeon: Nadara Mustard, MD;  Location: [facility];  Service: Orthopedics;  Laterality: Left;  . SEPTOPLASTY     x2  . TOTAL KNEE ARTHROPLASTY Left 07/23/2016  . TOTAL  KNEE ARTHROPLASTY Left 07/23/2016   Procedure: LEFT TOTAL KNEE ARTHROPLASTY;  Surgeon: Marcene Corning, MD;  Location: [facility];  Service: Orthopedics;  Laterality: Left;    There were no vitals filed for this visit.      Subjective Assessment - 10/17/16 1240    Subjective Getting decorations out . decided not to get on roof.    Currently in Pain? No/denies            [facility] PT Assessment - 10/17/16 0001      AROM   Left Knee Extension -5   Left Knee Flexion 105     Strength   Left Hip Flexion 4+/5   Left Hip Extension 4+/5   Left Hip ABduction 4+/5   Left Knee Flexion 5/5   Left Knee Extension 5/5     Ambulation/Gait   Gait Comments Independent and no device                     [facility] Adult PT Treatment/Exercise - 10/17/16 0001      Knee/Hip Exercises: Aerobic   Recumbent Bike L3 x 6 minutes   Stepper L3 x 4 minutes due to fatigue.      Knee/Hip Exercises: Standing   Forward Step Up Left;Step Height: 8";15 reps;Hand Hold:  1   Step Down Left;15 reps;Step Height: 8";Hand Hold: 1   Other Standing Knee Exercises 4 way hip with SLS x 10 each way bilateral- cues for posture and core engagement  12 reps green band   Other Standing Knee Exercises tai chi  stepping forward and lateral each RT and LT with cues to keep weight on stance leg. moving in various dired=ctions trying to limit twisting in Le and keepin weight to stance leg as long as able before transition.      standing from low surface 14 inches 3 reps without UE assist, also did from 18 and 20 inch surface x 5 reps             PT Short Term Goals - 10/17/16 1316      PT SHORT TERM GOAL #1   Title Pt will be I with HEP to improve functional mobility.    Status Achieved     PT SHORT TERM GOAL #2   Title Pt will be able to walk without assistive device for short distances,<100 feet with step through gait pattern.    Status Achieved     PT SHORT TERM GOAL #3   Title Pt will be able to report  strategies for RICE, edema mgmt   Status Achieved           PT Long Term Goals - 10/17/16 1317      PT LONG TERM GOAL #1   Title Pt will be I with more advanced HEP for  left knee and bilateral hips.   Status On-going     PT LONG TERM GOAL #2   Title Pt will be able to push/pull up to 50# with no increase in knee pain in prep for return to work.      PT LONG TERM GOAL #3   Title Pt will demo 5/5 strength in bilateral LEs for improved function at work.    Baseline 4+/5 hip flexion up to 5-/5 LT quads and hip adduction   Status Partially Met     PT LONG TERM GOAL #4   Title Pt will report no UE assistance needed for standing from low surface   Baseline He was able to stand x 3 from 14 inch surface   Status Achieved     PT LONG TERM GOAL #5   Title Pt will score <45% limited on FOTO to demo improvement and readiness for work.    Status Unable to assess               Plan - 10/17/16 1236    Clinical Impression Statement worked on Careers adviser with steping and cuing for delayed weight shift and keeping steps more compact. His balance stepping with high knees was better after this. He was able to do band exercises correctly   PT Treatment/Interventions ADLs/Self Care Home Management;Cryotherapy;Electrical Stimulation;Iontophoresis 38m/ml Dexamethasone;Passive range of motion;Vasopneumatic Device;Manual techniques;Taping;Therapeutic activities;Therapeutic exercise;Patient/family education;Functional mobility training;Stair training;Gait training   PT Next Visit Plan Continue strength and balance , mobs and ROM, stepper as tolerated, resisted walking, 4 way hip with theraband    PT Home Exercise Plan hamstring stretches, quadsets, SLR, mini squats, wall slides, step ups, SLS, tandem stance, heel raises, hamstring curls with green band, TKE green band, LAQ green band, weight bearing with controlled stepping, 4 way hip with theraband     Consulted and Agree with Plan of Care  Patient      Patient will benefit from skilled therapeutic intervention in  order to improve the following deficits and impairments:  Decreased mobility, Decreased strength, Increased edema, Decreased balance, Decreased activity tolerance, Impaired perceived functional ability, Difficulty walking, Decreased range of motion, Pain  Visit Diagnosis: S/P left knee arthroscopy  Knee stiffness, left  Acute pain of left knee  Difficulty walking  Stiffness of knee joint, right     Problem List Patient Active Problem List   Diagnosis Date Noted  . Primary osteoarthritis of left knee 07/23/2016  . 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 Diabetes ([place]) 03/01/2013    Darrel Hoover PT 10/17/2016, 1:32 PM  [facility] [address] [place], [place], [address] Phone: [phone]   Fax:  [phone]  Name: Jeff Edwards. MRN: [id number] Date of Birth: [birth year]

## 2016-10-18 DIAGNOSIS — M1712 Unilateral primary osteoarthritis, left knee: Secondary | ICD-10-CM | POA: Diagnosis not present

## 2016-10-22 ENCOUNTER — Ambulatory Visit: Payer: 59 | Attending: Orthopaedic Surgery

## 2016-10-22 DIAGNOSIS — M25662 Stiffness of left knee, not elsewhere classified: Secondary | ICD-10-CM | POA: Diagnosis not present

## 2016-10-22 DIAGNOSIS — Z9889 Other specified postprocedural states: Secondary | ICD-10-CM | POA: Insufficient documentation

## 2016-10-22 DIAGNOSIS — M25562 Pain in left knee: Secondary | ICD-10-CM | POA: Insufficient documentation

## 2016-10-22 DIAGNOSIS — G3184 Mild cognitive impairment, so stated: Secondary | ICD-10-CM | POA: Diagnosis not present

## 2016-10-22 DIAGNOSIS — M25661 Stiffness of right knee, not elsewhere classified: Secondary | ICD-10-CM | POA: Diagnosis not present

## 2016-10-22 NOTE — Therapy (Signed)
[place], [place], [address] Phone: [phone]   Fax:  [phone]  Physical Therapy Treatment/Discharge  Patient Details  Name: Jeff Edwards. MRN: [id number] Date of Birth: [birth year] Referring Provider: Shelly Coss, MD  Encounter Date: 10/22/2016      PT End of Session - 10/22/16 1235    Visit Number 15   Number of Visits 16   Date for PT Re-Evaluation 10/22/16   PT Start Time 1234   PT Stop Time 1325   PT Time Calculation (min) 51 min   Activity Tolerance Patient tolerated treatment well   Behavior During Therapy [facility] for tasks assessed/performed      Past Medical History:  Diagnosis Date  . ADHD (attention deficit hyperactivity disorder)   . Anemia    as a child  . 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 Basal cell carcinoma of left forearm   . Diverticulitis   . GERD (gastroesophageal reflux disease)   . High cholesterol   . Hypertension   . Memory change   . Neuropathy ([place])    in feet  . Osteoarthritis of both knees   . Retinal detachment    right eye; "no OR" (07/23/2016)  . Sleep apnea    cpap does not use (study done 10+ yrs) - has lost weight  . Type II diabetes mellitus ([place])     Past Surgical History:  Procedure Laterality Date  . ANKLE FRACTURE SURGERY Right 1980s   broken and reset    . BASAL CELL CARCINOMA EXCISION Left X 2   arm  . COLONOSCOPY    . FRACTURE SURGERY    . JOINT REPLACEMENT    . KNEE ARTHROSCOPY  07/10/2012   Procedure: ARTHROSCOPY KNEE;  Surgeon: Newt Minion, MD;  Location: [place];  Service: Orthopedics;  Laterality: Right;  Right knee arthroscopy and debridement, chrondralplasty  . KNEE ARTHROSCOPY Left 10/27/2015   Procedure: Left Knee Arthroscopy and Debridement;  Surgeon: Newt Minion, MD;  Location: [place];  Service: Orthopedics;  Laterality: Left;  . SEPTOPLASTY     x2  . TOTAL KNEE ARTHROPLASTY Left 07/23/2016  .  TOTAL KNEE ARTHROPLASTY Left 07/23/2016   Procedure: LEFT TOTAL KNEE ARTHROPLASTY;  Surgeon: Melrose Nakayama, MD;  Location: [place];  Service: Orthopedics;  Laterality: Left;    There were no vitals filed for this visit.      Subjective Assessment - 10/22/16 1237    Subjective MD pleased with progress and he is now cleared for light duty at work.     Currently in Pain? Yes   Pain Score 1    Pain Location Knee   Pain Orientation Left;Anterior   Pain Descriptors / Indicators Sore   Pain Type Surgical pain   Pain Onset More than a month ago   Pain Frequency Intermittent   Aggravating Factors  ncreased activity   Pain Relieving Factors meds , ice   Multiple Pain Sites No            [facility] PT Assessment - 10/22/16 0001      Observation/Other Assessments   Focus on Therapeutic Outcomes (FOTO)  53% limited meeting predicted value by FOTO from start of episode.      AROM   Left Knee Extension -5   Left Knee Flexion 105     PROM   Left Knee Extension -2-3    Left Knee Flexion 114     Strength  Left Knee Flexion 5/5   Left Knee Extension 5/5                     [facility] Adult PT Treatment/Exercise - 10/22/16 0001      Therapeutic Activites    Work Simulation 50 feet x 1 each weight level pudh and pull,  5 pounds max without pain and reports pushing beds at work easier.      Knee/Hip Exercises: Aerobic   Recumbent Bike L3 x 6 minutes   Stepper L3 x 4 minutes due to fatigue.      Knee/Hip Exercises: Standing   Lateral Step Up Left;Hand Hold: 1;10 reps  12 inch  step   Forward Step Up 20 reps;Left;Hand Hold: 2  12 inch step   Other Standing Knee Exercises sidesteps RT and LT 10 feet  red band x 5 and walking foreward x 20 feet x2                  PT Short Term Goals - 10/17/16 1316      PT SHORT TERM GOAL #1   Title Pt will be I with HEP to improve functional mobility.    Status Achieved     PT SHORT TERM GOAL #2   Title Pt will be able to walk  without assistive device for short distances,<100 feet with step through gait pattern.    Status Achieved     PT SHORT TERM GOAL #3   Title Pt will be able to report strategies for RICE, edema mgmt   Status Achieved           PT Long Term Goals - 10/22/16 1257      PT LONG TERM GOAL #1   Title Pt will be I with more advanced HEP for  left knee and bilateral hips.   Status Achieved     PT LONG TERM GOAL #2   Title Pt will be able to push/pull up to 50# with no increase in knee pain in prep for return to work.    Baseline 55 pounds today without pain   Status Achieved     PT LONG TERM GOAL #3   Title Pt will demo 5/5 strength in bilateral LEs for improved function at work.    Baseline 4+/5 hip flexion up to 5-/5 LT quads and hip adduction   Status Partially Met     PT LONG TERM GOAL #4   Title Pt will report no UE assistance needed for standing from low surface   Baseline He was able to stand x 3 from 14 inch surface   Status Achieved     PT LONG TERM GOAL #5   Title Pt will score <45% limited on FOTO to demo improvement and readiness for work.    Baseline 53% limited . Only 3 point change from 09/19/16   Status Partially Met               Plan - 10/22/16 1320    Clinical Impression Statement Mr Hume is ready for discharge and he agrees to continue with HEP.    PT Treatment/Interventions ADLs/Self Care Home Management;Cryotherapy;Electrical Stimulation;Iontophoresis 75m/ml Dexamethasone;Passive range of motion;Vasopneumatic Device;Manual techniques;Taping;Therapeutic activities;Therapeutic exercise;Patient/family education;Functional mobility training;Stair training;Gait training   PT Next Visit Plan Discharge with HEP   PT Home Exercise Plan hamstring stretches, quadsets, SLR, mini squats, wall slides, step ups, SLS, tandem stance, heel raises, hamstring curls with green band, TKE green band, LAQ green band,  weight bearing with controlled stepping, 4 way hip with  theraband     Consulted and Agree with Plan of Care Patient      Patient will benefit from skilled therapeutic intervention in order to improve the following deficits and impairments:  Decreased mobility, Decreased strength, Increased edema, Decreased balance, Decreased activity tolerance, Impaired perceived functional ability, Difficulty walking, Decreased range of motion, Pain  Visit Diagnosis: S/P left knee arthroscopy  Knee stiffness, left  Acute pain of left knee  Stiffness of knee joint, right     Problem List Patient Active Problem List   Diagnosis Date Noted  . Primary osteoarthritis of left knee 07/23/2016  . 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 Diabetes ([place]) 03/01/2013    Darrel Hoover   PT 10/22/2016, 1:26 PM  [facility] [address] [place], [place], [address] Phone: [phone]   Fax:  [phone]  Name: Jeff Edwards. MRN: [id number] Date of Birth: 08/13/1956   PHYSICAL THERAPY DISCHARGE SUMMARY  Visits from Start of Care: 15  Current functional level related to goals / functional outcomes: See above   Remaining deficits: See above   Education / Equipment: *HEP Plan: Patient agrees to discharge.  Patient goals were met. Patient is being discharged due to being pleased with the current functional level.  ?????

## 2016-10-23 DIAGNOSIS — E1149 Type 2 diabetes mellitus with other diabetic neurological complication: Secondary | ICD-10-CM | POA: Diagnosis not present

## 2016-10-23 DIAGNOSIS — M1990 Unspecified osteoarthritis, unspecified site: Secondary | ICD-10-CM | POA: Diagnosis not present

## 2016-10-23 DIAGNOSIS — E1129 Type 2 diabetes mellitus with other diabetic kidney complication: Secondary | ICD-10-CM | POA: Diagnosis not present

## 2016-10-23 DIAGNOSIS — R809 Proteinuria, unspecified: Secondary | ICD-10-CM | POA: Diagnosis not present

## 2016-10-23 DIAGNOSIS — J309 Allergic rhinitis, unspecified: Secondary | ICD-10-CM | POA: Diagnosis not present

## 2016-10-23 DIAGNOSIS — E782 Mixed hyperlipidemia: Secondary | ICD-10-CM | POA: Diagnosis not present

## 2016-10-23 DIAGNOSIS — F909 Attention-deficit hyperactivity disorder, unspecified type: Secondary | ICD-10-CM | POA: Diagnosis not present

## 2016-10-23 DIAGNOSIS — I10 Essential (primary) hypertension: Secondary | ICD-10-CM | POA: Diagnosis not present

## 2016-10-23 DIAGNOSIS — E1140 Type 2 diabetes mellitus with diabetic neuropathy, unspecified: Secondary | ICD-10-CM | POA: Diagnosis not present

## 2016-10-23 MED FILL — HYDROCODON-APAP 7.5-325: 7.5-325 | 3 days supply | Qty: 30 | Fill #0

## 2016-10-24 MED FILL — JANUVIA 100 MG TABLET: 100 | 30 days supply | Qty: 30 | Fill #0

## 2016-10-28 ENCOUNTER — Ambulatory Visit (INDEPENDENT_AMBULATORY_CARE_PROVIDER_SITE_OTHER): Payer: 59 | Admitting: Ophthalmology

## 2016-10-28 DIAGNOSIS — E113293 Type 2 diabetes mellitus with mild nonproliferative diabetic retinopathy without macular edema, bilateral: Secondary | ICD-10-CM

## 2016-10-28 DIAGNOSIS — H35713 Central serous chorioretinopathy, bilateral: Secondary | ICD-10-CM

## 2016-10-28 DIAGNOSIS — I10 Essential (primary) hypertension: Secondary | ICD-10-CM | POA: Diagnosis not present

## 2016-10-28 DIAGNOSIS — H35033 Hypertensive retinopathy, bilateral: Secondary | ICD-10-CM

## 2016-10-28 DIAGNOSIS — E11319 Type 2 diabetes mellitus with unspecified diabetic retinopathy without macular edema: Secondary | ICD-10-CM

## 2016-10-28 DIAGNOSIS — H43813 Vitreous degeneration, bilateral: Secondary | ICD-10-CM

## 2016-11-01 MED FILL — PANTOPRAZOLE SOD DR 40 MG T: 40 | 90 days supply | Qty: 90 | Fill #0

## 2016-11-04 ENCOUNTER — Ambulatory Visit (INDEPENDENT_AMBULATORY_CARE_PROVIDER_SITE_OTHER): Payer: 59 | Admitting: Neurology

## 2016-11-04 ENCOUNTER — Encounter: Payer: Self-pay | Admitting: Neurology

## 2016-11-04 VITALS — BP 136/84 | HR 80 | Ht 68.0 in | Wt 240.0 lb

## 2016-11-04 DIAGNOSIS — F988 Other specified behavioral and emotional disorders with onset usually occurring in childhood and adolescence: Secondary | ICD-10-CM | POA: Diagnosis not present

## 2016-11-04 DIAGNOSIS — R413 Other amnesia: Secondary | ICD-10-CM | POA: Diagnosis not present

## 2016-11-04 NOTE — Progress Notes (Signed)
PATIENT: Jeff Edwards. DOB: 08/07/1956  Chief Complaint  Patient presents with  . Episode of dizziness    Reports one episode, on 10/03/16, of having a strange sensation behind his right eye and right side of head then feeling dizzy/light-headed for a couple of hours.  States his BP and blood sugar were both normal.  Symptoms resolved without treatment.  . ADHD    He has long history of ADHD.  He has taken methylphenidate in the past to assist with focus.  He stopped the medication due to adverse side effects.  Says his focus and forgetfulness is much worse.  . Memory Loss    MMSE 28/30 - 18 animals.  Feels his overall memory has been in a slow decline.   Marland Kitchen PCP    [name], MD     HISTORICAL  Jeff Edwards. is a 59 years old right-handed male seen in refer by his primary care doctor [name] for evaluation of difficulty with memory, initial evaluation was November 04 2016.  He was a patient of Dr. love in 2013, for similar complaints, difficulty focusing, mild depression, difficulty. Attention to details, per record, MRI of the brain showed mild supratentorium Rijo vessel disease, laboratory evaluation showed no significant abnormality, CMP, TSH, vitamin D,  He was referred for neuropsychiatric evaluation by Dr. Valentina Shaggy in September 2013, indicative of ADHD, he has been treated methylphenidate 5 mg with good result, Mini-Mental Status then was 28/30, patient reported higher dose of methylphenidate 10 mg make him have angry argument with his wife.  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  He now complains of worsening memory  trouble, has moment of forgetting direction while driving sometimes, no loss of consciousness, misplaced things, forget cooking recipes.  He denies a history of seizure, he denies significant dimension right now,  I reviewed laboratory evaluation A1c 8.3 in August 2017, cbc, bmp glucose 162.  REVIEW OF SYSTEMS: Full 14 system review of systems performed and notable only for hearing loss, ringing ears, murmur, diarrhea, environmental allergy, joint pain, joint swelling, neck pain  ALLERGIES: Allergies  Allergen Reactions  . Penicillins Other (See Comments)    SYNCOPE    HOME MEDICATIONS: Current Outpatient Prescriptions  Medication Sig Dispense Refill  . aspirin EC 325 MG EC tablet Take 1 tablet (325 mg total) by mouth 2 (two) times daily after a meal. 30 tablet 0  . atorvastatin (LIPITOR) 20 MG tablet Take 20 mg by mouth every evening.     . B Complex-C (SUPER B COMPLEX PO) Take 1 tablet by mouth daily.    . cetirizine (ZYRTEC) 10 MG tablet Take 10 mg by mouth daily.     . Flaxseed, Linseed, (FLAX SEED OIL) 1000 MG CAPS Take 1,000 mg by mouth daily.    Marland Kitchen HYDROcodone-acetaminophen (NORCO) 7.5-325 MG tablet as needed.  0  . JANUVIA 100 MG tablet daily.  12  . lisinopril (PRINIVIL,ZESTRIL) 5 MG tablet Take 5 mg by mouth every evening.     . Magnesium 250 MG TABS Take 250 mg by mouth daily.    . metFORMIN (GLUCOPHAGE) 500 MG tablet Take 1,000 mg  by mouth 2 (two) times daily with a meal.     . methocarbamol (ROBAXIN) 500 MG tablet Take 1 tablet (500 mg total) by mouth every 6 (six) hours as needed for muscle spasms. 50 tablet 0  . Multiple Vitamin (MULTIVITAMIN WITH MINERALS) TABS Take 1 tablet by mouth daily.    . niacin (SLO-NIACIN) 500 MG tablet Take 500 mg by mouth at bedtime.    . pantoprazole (PROTONIX) 40 MG tablet Take 40 mg by mouth daily.     No current facility-administered medications for this visit.     PAST MEDICAL HISTORY: Past Medical History:  Diagnosis Date  . ADHD  (attention deficit hyperactivity disorder)   . Anemia    as a child  . 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 Basal cell carcinoma of left forearm   . Diverticulitis   . GERD (gastroesophageal reflux disease)   . High cholesterol   . Hypertension   . Memory change   . Neuropathy ([place])    in feet  . Osteoarthritis of both knees   . Retinal detachment    right eye; "no OR" (07/23/2016)  . Sleep apnea    cpap does not use (study done 10+ yrs) - has lost weight  . Type II diabetes mellitus ([place])     PAST SURGICAL HISTORY: Past Surgical History:  Procedure Laterality Date  . ANKLE FRACTURE SURGERY Right 1980s   broken and reset    . BASAL CELL CARCINOMA EXCISION Left X 2   arm  . COLONOSCOPY    . FRACTURE SURGERY    . JOINT REPLACEMENT    . KNEE ARTHROSCOPY  07/10/2012   Procedure: ARTHROSCOPY KNEE;  Surgeon: Newt Minion, MD;  Location: [facility];  Service: Orthopedics;  Laterality: Right;  Right knee arthroscopy and debridement, chrondralplasty  . KNEE ARTHROSCOPY Left 10/27/2015   Procedure: Left Knee Arthroscopy and Debridement;  Surgeon: Newt Minion, MD;  Location: [facility];  Service: Orthopedics;  Laterality: Left;  . SEPTOPLASTY     x2  . TOTAL KNEE ARTHROPLASTY Left 07/23/2016  . TOTAL KNEE ARTHROPLASTY Left 07/23/2016   Procedure: LEFT TOTAL KNEE ARTHROPLASTY;  Surgeon: Melrose Nakayama, MD;  Location: [facility];  Service: Orthopedics;  Laterality: Left;    FAMILY HISTORY: Family History  Problem Relation Age of Onset  . Alzheimer's disease Mother   . High blood pressure Mother   . Other Father     Sepsis  . Dementia Father   . Heart disease Maternal Grandfather   . Diabetes Sister     SOCIAL HISTORY:  Social History   Social History  . Marital status: Married    Spouse name: Sula Soda  . Number of children: 0  . Years of education: college   Occupational History  . currently on leave [place] History [employer]  Topics  . Smoking status: Former Smoker    Packs/day: 0.75    Years: 10.00    Types: Cigarettes  . Smokeless tobacco: Never Used     Comment: "quit smoking in ~ 1981/1982  . Alcohol use No  . Drug use: No  . Sexual activity: Yes   Other Topics Concern  . Not on file   Social History Narrative   Patient lives at home with his wife Sula Soda). 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  Patient has some college education.   Right handed.   Caffeine- Quit 04-22-2013  [employer]     PHYSICAL EXAM   Vitals:   11/04/16 0849  BP: 136/84  Pulse: 80  Weight: 240 lb (108.9 kg)  Height: 5\' 8"  (1.727 m)    Not recorded      Body mass index is 36.49 kg/m.  PHYSICAL EXAMNIATION:  Gen: NAD, conversant, well nourised, obese, well groomed                     Cardiovascular: Regular rate rhythm, no peripheral edema, warm, nontender. Eyes: Conjunctivae clear without exudates or hemorrhage Neck: Supple, no carotid bruits. Pulmonary: Clear to auscultation bilaterally   NEUROLOGICAL EXAM:  MENTAL STATUS: Speech:    Speech is normal; fluent and spontaneous with normal comprehension.  Cognition:Mini-Mental Status Examination 28/30, animal naming 18     Orientation to time, place and person     Recent and remote memory: He missed a 2/3 recalls     Normal Attention span and concentration     Normal Language, naming, repeating,spontaneous speech     Fund of knowledge   CRANIAL NERVES: CN II: Visual fields are full to confrontation. Fundoscopic exam is normal with sharp discs and no vascular changes. Pupils are round equal and briskly reactive to light. CN III, IV, VI: extraocular movement are normal. No ptosis. CN V: Facial sensation is intact to pinprick in all 3 divisions bilaterally. Corneal responses are intact.  CN VII: Face is symmetric with normal eye closure and smile. CN VIII: Hearing is normal to rubbing fingers CN IX, X: Palate elevates symmetrically. Phonation is  normal. CN XI: Head turning and shoulder shrug are intact CN XII: Tongue is midline with normal movements and no atrophy.  MOTOR: There is no pronator drift of out-stretched arms. Muscle bulk and tone are normal. Muscle strength is normal.  REFLEXES: Reflexes are 2+ and symmetric at the biceps, triceps, knees, and ankles. Plantar responses are flexor.  SENSORY: Intact to light touch, pinprick, positional sensation and vibratory sensation are intact in fingers and toes.  COORDINATION: Rapid alternating movements and fine finger movements are intact. There is no dysmetria on finger-to-nose and heel-knee-shin.    GAIT/STANCE: Posture is normal. Gait is steady with normal steps, base, arm swing, and turning. Heel and toe walking are normal. Tandem gait is normal.  Romberg is absent.   DIAGNOSTIC DATA (LABS, IMAGING, TESTING) - I reviewed patient records, labs, notes, testing and imaging myself where available.   ASSESSMENT AND PLAN  Jamis Clide Brainerd. is a 60 y.o. male   Mild cognitive impairment Differentiation diagnosis including central nervous system degenerative disorder, versus ADHD, mood disorder related Refer him to neuropsychiatric evaluation MRI of the brain Laboratory evaluations to rule out treatable etiology   Marcial Pacas, M.D. Ph.D.  [facility] [address], [place], [place] [address] Ph: [phone] Fax: [phone]  CC: Referring Provider

## 2016-11-05 LAB — FOLATE: Folate: >20 ng/mL (ref >3.0)

## 2016-11-05 LAB — THYROID PANEL WITH TSH
FREE THYROXINE INDEX: 2.2 (ref 1.2–4.9)
T3 UPTAKE RATIO: 24 % (ref 24–39)
T4, Total: 9.2 ug/dL (ref 4.5–12.0)
TSH: 0.981 u[IU]/mL (ref 0.450–4.500)

## 2016-11-05 LAB — VITAMIN B12: Vitamin B-12: 552 pg/mL (ref 232–1245)

## 2016-11-05 LAB — C-REACTIVE PROTEIN: CRP: 3.6 mg/L (ref 0.0–4.9)

## 2016-11-05 LAB — RPR: RPR Ser Ql: NONREACTIVE

## 2016-11-05 LAB — SEDIMENTATION RATE: Sed Rate: 10 mm/hr (ref 0–30)

## 2016-11-06 ENCOUNTER — Ambulatory Visit
Admission: RE | Admit: 2016-11-06 | Discharge: 2016-11-06 | Disposition: A | Discharge status: Home / Self Care | Payer: 59 | Source: Ambulatory Visit | Attending: Neurology | Admitting: Neurology

## 2016-11-06 ENCOUNTER — Telehealth: Payer: Self-pay | Admitting: Neurology

## 2016-11-06 DIAGNOSIS — R413 Other amnesia: Secondary | ICD-10-CM

## 2016-11-06 DIAGNOSIS — F988 Other specified behavioral and emotional disorders with onset usually occurring in childhood and adolescence: Secondary | ICD-10-CM

## 2016-11-06 DIAGNOSIS — G3184 Mild cognitive impairment, so stated: Secondary | ICD-10-CM | POA: Diagnosis not present

## 2016-11-06 NOTE — Telephone Encounter (Signed)
Spoke to patient - aware of results. 

## 2016-11-06 NOTE — Telephone Encounter (Signed)
Please call patient, MRI brain showed age related changes, no acute abnormalities.

## 2016-11-12 MED FILL — LISINOPRIL 5 MG TABLET: 5 | 90 days supply | Qty: 90 | Fill #0

## 2016-11-29 ENCOUNTER — Ambulatory Visit (INDEPENDENT_AMBULATORY_CARE_PROVIDER_SITE_OTHER): Payer: 59 | Admitting: Ophthalmology

## 2016-12-12 ENCOUNTER — Ambulatory Visit (INDEPENDENT_AMBULATORY_CARE_PROVIDER_SITE_OTHER): Payer: PRIVATE HEALTH INSURANCE | Admitting: Neurology

## 2016-12-12 DIAGNOSIS — R413 Other amnesia: Secondary | ICD-10-CM

## 2016-12-12 DIAGNOSIS — R410 Disorientation, unspecified: Secondary | ICD-10-CM | POA: Diagnosis not present

## 2016-12-12 NOTE — Procedures (Signed)
    History:  Jeff Edwards is a 61 year old gentleman with a history of memory problems, the patient may have episodes of forgetting directions while driving without associated loss of consciousness. The patient also has episodes of lightheaded or dizzy sensation lasting several hours. He is being evaluated for these events.  This is a routine EEG. No skull defects are noted. Medications include aspirin, Lipitor, hydrocodone, lisinopril, magnesium supplementation, metformin, methocarbamol, multivitamins, niacin, and Protonix.   EEG classification: Normal awake  Description of the recording: The background rhythms of this recording consists of a fairly well modulated medium amplitude alpha rhythm of 10 Hz that is reactive to eye opening and closure. As the record progresses, the patient appears to remain in the waking state throughout the recording. Photic stimulation was performed, resulting in a bilateral and symmetric photic driving response. Hyperventilation was also performed, resulting in a minimal buildup of the background rhythm activities without significant slowing seen. At no time during the recording does there appear to be evidence of spike or spike wave discharges or evidence of focal slowing. EKG monitor shows no evidence of cardiac rhythm abnormalities with a heart rate of 78.  Impression: This is a normal EEG recording in the waking state. No evidence of ictal or interictal discharges are seen.

## 2016-12-16 ENCOUNTER — Telehealth: Payer: Self-pay | Admitting: *Deleted

## 2016-12-16 NOTE — Telephone Encounter (Signed)
-----   Message from Kathrynn Ducking, MD sent at 12/12/2016  5:58 PM EST ----- Please call the patient and let him know that the EEG study was normal. Thank you.

## 2016-12-16 NOTE — Telephone Encounter (Signed)
Spoke to patient - he is aware of results and will keep his pending follow up.

## 2016-12-17 ENCOUNTER — Ambulatory Visit (INDEPENDENT_AMBULATORY_CARE_PROVIDER_SITE_OTHER): Payer: PRIVATE HEALTH INSURANCE | Admitting: Psychology

## 2016-12-17 DIAGNOSIS — F988 Other specified behavioral and emotional disorders with onset usually occurring in childhood and adolescence: Secondary | ICD-10-CM

## 2016-12-17 DIAGNOSIS — R413 Other amnesia: Secondary | ICD-10-CM | POA: Diagnosis not present

## 2016-12-17 NOTE — Progress Notes (Signed)
NEUROPSYCHOLOGICAL INTERVIEW (CPT: [id number])  Name: Jeff Edwards. Date of Birth: July 29, 1956 Date of Interview: 12/17/2016  Reason for Referral:  Jeff Edwards. is a 61 y.o., right-handed male who is referred for neuropsychological evaluation by Jeff Edwards of [facility] due to concerns about memory loss. This patient is unaccompanied in the office for today's appointment.  History of Presenting Problem:  Jeff Edwards reportedly has a long history of ADHD (evaluated and diagnosed by Jeff Edwards in 2011) and was treated with methylphenidate for a while but stopped this medication due to increaed irritability/argumentativeness. He has also been concerned about memory decline over recent years. He saw Jeff Edwards on 11/04/2017 and scored 28/30 on the MMSE. He has a family history of Alzheimer's dementia in his mother who showed signs in her 90s.   Jeff Edwards also reported that he experienced a recent episode of "weird sensation" in his right eye and on the right side of his head. He felt as though he may pass out. He was driving at the time. He felt lightheaded for several hours afterwards. He had an EEG and MRI; both were reportedly normal, which was reassuring to the patient.  The patient also reported that he has had several episodes of disorientation while driving. These last only 2-3 seconds but in that time he does not know where he is and where he is going. However, he becomes reoriented quickly. He noted that he never takes pain medication (which he is prescribed for his knee) when he is going to be driving, and he tries to minimize his use of pain medication overall.  The patient notes that his wife is very concerned about his memory. He has forgotten whole conversations they have had, and when she repeats the conversation to him it does not jog his memory. He does not feel he is distracted or preoccupied in conversation. It is very frustrating to his wife that this happens,  and it is starting to affect their relationship.  Upon direct questioning, the patient also reported: some forgetfulness with medications (on occasion he is unsure if he has taken them or not, and he does not use a daily pillbox so he cannot check), being more uncertain about directions when driving. He denied repeating statements/questions, misplacing/losing items more frequently, forgetting appointments/obligations, word finding difficulty and getting lost when driving.  Jeff Edwards reported a remote history of several concussions; no medical treatment was sought for any of these. He reported he was knocked unconscious when he was about 4 or 61 yo, when ran into another child while playing. He lost consciousness for several minutes but <30 mins. He rested at home afterwards and recalls being "groggy". Also as a child, his sister accidentally hit him in the head with a croquet mallet. He had +LOC for a few minutes. When playing football in high school, he landed on his head and did not lose consciousness and kept playing but had no memory of the three plays afterwards. When he worked in the [employer] in his early 20s, his head was "crunched" between the rock and cart and he was dazed afterwards.  Jeff Edwards also reported a history of learning difficulties as a child and adolescent. He had to repeat the fifth grade. He had difficulty with reading comprehension and definitely had difficulty with concentration and focus in school. His grades were "terrible" in high school. A teacher worked closely with him in high school and this helped improve his reading level  to the appropriate grade level. He has always been better with visual or hands on learning. He has always had difficulty finishing tasks, perhaps because he gets bored easily. He notes he has gotten bored with careers also, and the longest job he has held was for 11 years. He gets distracted easily. He has always felt his processing speed is slowed. He does  very well with structure/routine and describes himself as "obsessive compulsive". It is hard for him to be flexible with routines/systems he has developed. For example, he does not let his wife help him do the dishes because he has a certain way of stacking them that maximizes space.  Psychiatric history is reportedly significant for some depression in the past. He worked with a Social worker and found this very helpful. He admits that in times of depression he did experience suicidal ideation but he denied history of plan or attempt.   Current Functioning: Jeff Edwards is currently unemployed but looking for work. He continues to manage all instrumental ADLs including driving, medications, finances (no problems), appointments and cooking. He notes he has forgotten some recipes.   He has some difficulty with balance. He fell twice before his knee replacement but has not had any falls since the surgery. He has been doing physical rehabilitation and has seen his strength improve.  The patient describes his current mood as "better since I quit taking Adderall". He does feel depressed mood coming on once in a while but is able to "push it away". He is anxious about finding a job at his age and with his perceived cognitive difficulties.   He reported that his sleep is restless. He has sleep apnea and does not use a CPAP. He didn't like the device because it put pressure on his ears; instead of following up with his doctor to have the pressure adjusted, he stopped using the device.  His appetite is good. He has diabetes and notes that his blood sugars and weight have both gone up.   Social History: Jeff Edwards was born and raised in [place]. Early educational history is described above. After graduating from high school, he worked in the [employer]. He did go to [employer] for one semester. 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 When his mom got sick with dementia, he lost focus at work and his sales went down. After his mother passed away, he and his wife moved back to [place] to take care of his father. 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 They moved back to [place]. It took him three years to find another job. He worked as a Social research officer, government for [employer] and really enjoyed this job. He was there over 4 years. He has been out of work since December 2016 when he first started having knee problems.  Mr. Yousuf has been married since 17. He and his wife do not have any children but do have two dogs who are very special to them. He does not drink alcohol. He is a former smoker (smoked for 10 years, quit >20 years ago). He denied substance abuse/dependence.   Medical History: Past Medical History:  Diagnosis Date  . ADHD (attention deficit hyperactivity disorder)   . Anemia    as a child  . 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  Basal cell carcinoma of left forearm   . Diverticulitis   . GERD (gastroesophageal reflux disease)   . High cholesterol   . Hypertension   . Memory change   . Neuropathy ([place])    in feet  . Osteoarthritis of both knees   . Retinal detachment    right eye; "no OR" (07/23/2016)  . Sleep apnea    cpap does not use (study done 10+ yrs) - has lost weight  . Type II diabetes mellitus ([facility])     Current Medications:  Outpatient Encounter Prescriptions as of 12/17/2016  Medication Sig  . aspirin EC 325 MG EC tablet Take 1 tablet (325 mg total) by mouth 2 (two) times daily after a meal.  . atorvastatin (LIPITOR) 20 MG tablet Take 20 mg by mouth every evening.   . B Complex-C (SUPER B COMPLEX PO) Take 1 tablet by mouth daily.  . cetirizine (ZYRTEC) 10 MG tablet Take 10 mg by mouth daily.   . Flaxseed, Linseed, (FLAX SEED OIL) 1000 MG CAPS Take 1,000 mg by mouth daily.  Marland Kitchen  HYDROcodone-acetaminophen (NORCO) 7.5-325 MG tablet as needed.  Marland Kitchen JANUVIA 100 MG tablet daily.  Marland Kitchen lisinopril (PRINIVIL,ZESTRIL) 5 MG tablet Take 5 mg by mouth every evening.   . Magnesium 250 MG TABS Take 250 mg by mouth daily.  . metFORMIN (GLUCOPHAGE) 500 MG tablet Take 1,000 mg by mouth 2 (two) times daily with a meal.   . methocarbamol (ROBAXIN) 500 MG tablet Take 1 tablet (500 mg total) by mouth every 6 (six) hours as needed for muscle spasms.  . Multiple Vitamin (MULTIVITAMIN WITH MINERALS) TABS Take 1 tablet by mouth daily.  . niacin (SLO-NIACIN) 500 MG tablet Take 500 mg by mouth at bedtime.  . pantoprazole (PROTONIX) 40 MG tablet Take 40 mg by mouth daily.   No facility-administered encounter medications on file as of 12/17/2016.      Behavioral Observations:   Appearance: Appropriately dressed and groomed Gait: Ambulated independently, stiffness in one leg s/p knee surgery Speech: Fluent; normal rate, rhythm and volume Thought process: Linear, goal directed Affect: Full, euthymic Interpersonal: Pleasant, personable, appropriate   TESTING: There is medical necessity to proceed with neuropsychological assessment as the results will be used to aid in differential diagnosis and clinical decision-making and to inform specific treatment recommendations. Per the patient and medical records reviewed, there has been a change in cognitive functioning and a reasonable suspicion of MCI or developing dementia superimposed on preexisting ADHD.   PLAN: The patient will return for a full battery of neuropsychological testing with a psychometrician under my supervision. Education regarding testing procedures was provided. Subsequently, the patient will see this provider for a follow-up session at which time his test performances and my impressions and treatment recommendations will be reviewed in detail.   Full neuropsychological evaluation report to follow.

## 2016-12-18 ENCOUNTER — Encounter: Payer: Self-pay | Admitting: Psychology

## 2016-12-23 ENCOUNTER — Ambulatory Visit (INDEPENDENT_AMBULATORY_CARE_PROVIDER_SITE_OTHER): Payer: PRIVATE HEALTH INSURANCE | Admitting: Psychology

## 2016-12-23 DIAGNOSIS — R413 Other amnesia: Secondary | ICD-10-CM

## 2016-12-23 NOTE — Progress Notes (Signed)
   Neuropsychology Note  Atley Eduard Roux. returned today for 3 hours of neuropsychological testing with technician, Milana Kidney, BS, under the supervision of Dr. Macarthur Critchley. The patient did not appear overtly distressed by the testing session, per behavioral observation or via self-report to the technician. Rest breaks were offered. Jeff Edwards. will return within 2 weeks for a feedback session with Dr. Si Raider at which time his test performances, clinical impressions and treatment recommendations will be reviewed in detail. The patient understands he can contact our office should he require our assistance before this time.  Full report to follow.

## 2016-12-31 MED FILL — ATORVASTATIN 20 MG TABLET: 20 | 30 days supply | Qty: 30 | Fill #0 | Status: TO

## 2017-01-01 NOTE — Progress Notes (Signed)
NEUROPSYCHOLOGICAL EVALUATION   Name:    Jeff Edwards.  Date of Birth:   1956/02/19 Date of Interview:  12/17/2016 Date of Testing:  12/23/2016   Date of Feedback:  01/02/2017       Background Information:  Reason for Referral:  Jeff Edwards. is a 61 y.o., right-handed male referred by Dr. Marcial Edwards to assess his current level of cognitive functioning and assist in differential diagnosis. The current evaluation consisted of a review of available medical records, an interview with the patient, and the completion of a neuropsychological testing battery. Informed consent was obtained.  History of Presenting Problem:  Jeff Edwards reportedly has a long history of ADHD (evaluated and diagnosed by Dr. Valentina Edwards in 2011) and was treated with methylphenidate for a while with a good effect, but stopped this medication due to increaed irritability/argumentativeness. He has also been concerned about memory decline over recent years. He saw Jeff Edwards on 11/04/2017 and scored 28/30 on the MMSE. He has a family history of Alzheimer's dementia in his mother who showed signs in her 2s.   Jeff Edwards also reported that he experienced a recent episode of "weird sensation" in his right eye and on the right side of his head. He felt as though he may pass out. He was driving at the time. He felt lightheaded for several hours afterwards. He had an EEG and MRI; both were reportedly normal, which was reassuring to the patient.  The patient also reported that he has had several episodes of disorientation while driving. These last only 2-3 seconds but in that time he does not know where he is and where he is going. However, he becomes reoriented quickly. He noted that he never takes pain medication (which he is prescribed for his knee) when he is going to be driving, and he tries to minimize his use of pain medication overall.  The patient notes that his wife is very concerned about his memory. He has forgotten whole  conversations they have had, and when she repeats the conversation to him it does not jog his memory. He does not feel he is distracted or preoccupied in conversation. It is very frustrating to his wife that this happens, and it is starting to affect their relationship.  Upon direct questioning, the patient also reported: some forgetfulness with medications (on occasion he is unsure if he has taken them or not, and he does not use a daily pillbox so he cannot check), being more uncertain about directions when driving. He denied repeating statements/questions, misplacing/losing items more frequently, forgetting appointments/obligations, word finding difficulty and getting lost when driving.  Jeff Edwards reported a remote history of several concussions; no medical treatment was sought for any of these. He reported he was knocked unconscious when he was about 4 or 61 yo, when ran into another child while playing. He lost consciousness for several minutes but <30 mins. He rested at home afterwards and recalls being "groggy". Also as a child, his sister accidentally hit him in the head with a croquet mallet. He had +LOC for a few minutes. When playing football in high school, he landed on his head and did not lose consciousness and kept playing but had no memory of the three plays afterwards. When he worked in the [employer] in his early 20s, his head was "crunched" between the rock and cart and he was dazed afterwards.  Jeff Edwards also reported a history of learning difficulties as a child and  adolescent. He had to repeat the fifth grade. He had difficulty with reading comprehension and definitely had difficulty with concentration and focus in school. His grades were "terrible" in high school. A teacher worked closely with him in high school and this helped improve his reading level to the appropriate grade level. He has always been better with visual or hands on learning. He has always had difficulty finishing  tasks, perhaps because he gets bored easily. He notes he has gotten bored with careers also, and the longest job he has held was for 11 years. He gets distracted easily. He has always felt his processing speed is slowed. He does very well with structure/routine and describes himself as "obsessive compulsive". It is hard for him to be flexible with routines/systems he has developed. For example, he does not let his wife help him do the dishes because he has a certain way of stacking them that maximizes space.  Psychiatric history is reportedly significant for some depression in the past. He worked with a Social worker and found this very helpful. He admits that in times of depression he did experience suicidal ideation but he denied history of plan or attempt.   Current Functioning: Jeff Edwards is currently unemployed but looking for work. He continues to manage all instrumental ADLs including driving, medications, finances (no problems), appointments and cooking. He notes he has forgotten some recipes.   He has some difficulty with balance. He fell twice before his knee replacement but has not had any falls since the surgery. He has been doing physical rehabilitation and has seen his strength improve.  The patient describes his current mood as "better since I quit taking Adderall". He does feel depressed mood coming on once in a while but is able to "push it away". He is anxious about finding a job at his age and with his perceived cognitive difficulties.   He reported that his sleep is restless. He has sleep apnea and does not use a CPAP. He didn't like the device because it put pressure on his ears; instead of following up with his doctor to have the pressure adjusted, he stopped using the device.  His appetite is good. He has diabetes and notes that his blood sugars and weight have both gone up.   Social History: Jeff Edwards was born and raised in [place]. Early educational history is  described above. After graduating from high school, he worked in the [employer]. He did go to [employer] for one semester. 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 When his mom got sick with dementia, he lost focus at work and his sales went down. After his mother passed away, he and his wife moved back to [place] to take care of his father. 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 They moved back to [place]. It took him three years to find another job. He worked as a Social research officer, government for [employer] and really enjoyed this job. He was there over 4 years. He has been out of work since December 2016 when he first started having knee problems.  Mr. Sinning has been married since 88. He and his wife do not have any children but do have two dogs who are very special to them. He does not drink alcohol. He is a former smoker (smoked for  10 years, quit >20 years ago). He denied substance abuse/dependence.   Medical History:  Past Medical History:  Diagnosis Date  . ADHD (attention deficit hyperactivity disorder)   . Anemia    as a child  . 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 Basal cell carcinoma of left forearm   . Diverticulitis   . GERD (gastroesophageal reflux disease)   . High cholesterol   . Hypertension   . Memory change   . Neuropathy ([place])    in feet  . Osteoarthritis of both knees   . Retinal detachment    right eye; "no OR" (07/23/2016)  . Sleep apnea    cpap does not use (study done 10+ yrs) - has lost weight  . Type II diabetes mellitus ([place])     Current medications:  Outpatient Encounter Prescriptions as of 01/02/2017  Medication Sig  . aspirin EC 325 MG EC tablet Take 1 tablet (325 mg total) by mouth 2 (two) times daily after a meal.  . atorvastatin (LIPITOR) 20 MG tablet Take 20 mg by mouth every  evening.   . B Complex-C (SUPER B COMPLEX PO) Take 1 tablet by mouth daily.  . cetirizine (ZYRTEC) 10 MG tablet Take 10 mg by mouth daily.   . Flaxseed, Linseed, (FLAX SEED OIL) 1000 MG CAPS Take 1,000 mg by mouth daily.  Marland Kitchen HYDROcodone-acetaminophen (NORCO) 7.5-325 MG tablet as needed.  Marland Kitchen JANUVIA 100 MG tablet daily.  Marland Kitchen lisinopril (PRINIVIL,ZESTRIL) 5 MG tablet Take 5 mg by mouth every evening.   . Magnesium 250 MG TABS Take 250 mg by mouth daily.  . metFORMIN (GLUCOPHAGE) 500 MG tablet Take 1,000 mg by mouth 2 (two) times daily with a meal.   . methocarbamol (ROBAXIN) 500 MG tablet Take 1 tablet (500 mg total) by mouth every 6 (six) hours as needed for muscle spasms.  . Multiple Vitamin (MULTIVITAMIN WITH MINERALS) TABS Take 1 tablet by mouth daily.  . niacin (SLO-NIACIN) 500 MG tablet Take 500 mg by mouth at bedtime.  . pantoprazole (PROTONIX) 40 MG tablet Take 40 mg by mouth daily.   No facility-administered encounter medications on file as of 01/02/2017.      Current Examination:  Behavioral Observations:   Appearance: Appropriately dressed and groomed Gait: Ambulated independently, stiffness in one leg s/p knee surgery Speech: Fluent; normal rate, rhythm and volume Thought process: Linear, goal directed Affect: Full, euthymic Interpersonal: Pleasant, personable, appropriate Orientation: Oriented to all spheres. Accurately named the current President and his predecessor.  Tests Administered: . Test of Premorbid Functioning (TOPF) . Wechsler Adult Intelligence Scale-Fourth Edition (WAIS-IV): Similarities, Block Design, Matrix Reasoning, Arithmetic, Symbol Search, Coding and Digit Span subtests . Wechsler Memory Scale-Fourth Edition (WMS-IV) Adult Version (ages 64-69): Logical Memory I, II and Recognition subtests  . [place] Verbal Learning Test - 2nd Edition (CVLT-2) Standard Form . Rey Complex Figure Test (RCFT) . Conners Continuous Performance Test 3rd Edition  (CPT3) . [employer] (WCST) . Repeatable Battery for the Assessment of Neuropsychological Status (RBANS) Form A:  Semantic Fluency Subtest . Neuropsychological Assessment Battery (NAB) Language Module, Form 1: Naming subtest . Controlled Oral Word Association Test (COWAT) . Trail Making Test A and B . Wender [place] Rating Scale [facility]) . Personality Assessment Inventory (PAI)  Test Results: Note: Standardized scores are presented only for use by appropriately trained professionals and to allow for any future test-retest comparison. These scores should not be interpreted without consideration of all the information that  is contained in the rest of the report. The most recent standardization samples from the test publisher or other sources were used whenever possible to derive standard scores; scores were corrected for age, gender, ethnicity and education when available.   Test Scores:  Test Name Raw Score Standardized Score Descriptor  TOPF 29/70 SS= 88 Low average  WAIS-IV Subtests     Similarities 26/36 ss= 10 Average  Block Design 38/66 ss= 11 Average  Matrix Reasoning 13/26 ss= 9 Average  Arithmetic 12/22 ss= 8 Low end of average  Symbol Search 21/60 ss= 7 Low average  Coding 54/135 ss= 9 Average  Digit Span 33/48 ss= 13 High average  WAIS-IV Index Scores     Working Memory  SS= 102 Average  Processing Speed  SS= 89 Low average  WMS-IV Subtests     LM I 21/50 ss= 8 Low end of average  LM II 16/50 ss= 8 Low end of average  LM II Recognition 26/30 Cum %: >75 Above average  CVLT-II Scores     Trial 1 3/16 Z= -2 Impaired  Trial 5 7/16 Z= -1.5 Borderline  Trials 1-5 total 28/80 T= 36 Borderline  SD Free Recall 3/16 Z= -1.5 Borderline  SD Cued Recall 3/16 Z= -3 Severely impaired  LD Free Recall 3/16 Z= -2 Impaired  LD Cued Recall 4/16 Z= -2 Impaired  Recognition Discriminability 10/16 hits, 2 false positives Z= -2.5 Impaired  Forced Choice Recognition 15/16  Abnormal   RCFT     Copy 33/36 >16%ile WNL  3' Recall 14/36 T= 45 Average  30' Recall 14.5/36 T= 45 Average  Recognition 18/24 T= 37 Low average  CPT3     Detectability  T= 42 Low average  Omissions  T= 45 Average  Commissions  T= 41 Low average  Perseverations  T= 44 Low average  HRT Block Change  T= 68 Elevated  WCST     Total Errors 10 T= 60 High average  Perseverative Responses 6 T= 57 High average  Perseverative Errors 6 T= 57 High average  Conceptual Level Responses 54 T= 64 Superior  Categories Completed 5 >16% WNL  Trials to Complete 1st Category 12 >16% WNL  Failure to Maintain Set 0  WNL  RBANS Subtest     Semantic Fluency 13 Z= -1.7 Borderline  NAB Language Naming 30/31 T= 54 Average  COWAT-FAS 33 T= 43 Average  COWAT-Animals 16 T= 45 Average  Trail Making Test A  30" 0 errors T= 55 Average  Trail Making Test B  77" 1 error T= 51 Average  WURS 38/100  Below cutoff  PAI No elevated clinical scales to report       Description of Test Results:  Premorbid verbal intellectual abilities were estimated to have been within the low average range based on a test of word reading. Psychomotor processing speed was low average to average. Auditory attention and working memory were average. On a test of visual sustained attention which is highly sensitive to ADHD, the patient did not demonstrate a pervasive pattern of atypical scores. In fact, he had only one atypical T score, which suggested some difficulty with sustained attention. His performance on the test did not indicate inattentiveness, impulsivity, or vigilance. Visual-spatial construction was average. Language abilities were mostly intact. Specifically, confrontation naming was average, and semantic verbal fluency ranged from average to borderline impaired. With regard to verbal memory, encoding and acquisition of non-contextual information (i.e., word list) was borderline impaired. After a brief interference task,  free recall was  borderline impaired. He did not recall any additional words even with semantic cueing (severely impaired). After a delay, free recall was impaired. Cued recall was impaired. Performance on a yes/no recognition task was impaired. On another verbal memory test, encoding and acquisition of contextual auditory information (i.e., short stories) was low end of average. After a delay, free recall was low end of average. Performance on a yes/no recognition task was above average. With regard to non-verbal memory, free recall of visual information was average after both a 3 minute and 30 minute delay. Performance on a yes/no recognition task was low average. Performances on tests measuring executive functioning were intact. Mental flexibility and set-shifting were average on Trails B. Verbal fluency with phonemic search restrictions was average. Verbal abstract reasoning was average. Non-verbal abstract reasoning was average. Deductive reasoning and problem solving were high average to superior.   On a self-report retrospective measure of childhood behaviors and symptoms, the patient's responses did not suggest that he would have met diagnostic criteria for ADHD as a child.  On an extensive measure of psychopathology and personality (PAI), symptom validity indicators were within normal limits, suggesting that he did not attempt to portray himself in a more negative or positive light than the clinical picture would warrant.  The PAI clinical profile reveals no marked elevations that should be considered to indicate the presence of clinical psychopathology.  Scores on one or more scales do, however, show moderate elevations that may reflect sources of difficulty for the person.  Specifically, the patient describes his thought processes as marked by confusion, distractibility, and difficulty concentrating. The patient also reports some difficulties consistent with relatively mild or transient depressive symptomatology.  In  particular, he appears to be pessimistic and dwells on thoughts of worthlessness, hopelessness, and personal failure. According to the patient's self-report, he describes NO significant problems in the following areas: antisocial behavior; problems with empathy; undue suspiciousness or hostility; extreme moodiness and impulsivity; unusually elevated mood or heightened activity; marked anxiety; problematic behaviors used to manage anxiety; difficulties with health or physical functioning.  Also, he reports NO significant problems with alcohol or drug abuse or dependence. With respect to suicidal ideation, the patient is NOT reporting distress from thoughts of self-harm.   Clinical Impressions: Amnestic mild cognitive impairment, single domain. ADHD (by history). Mr. Scheuring was previously diagnosed with ADHD by Dr. Valentina Edwards. Unfortunately these records are unavailable for my review, so no test-retest comparison can be made. It is surprising that current test results are largely unsupportive of ADHD. His only deficits on current testing are in non-contextual verbal memory. While reduced memory encoding is common in ADHD, it is surprising that he does not show any other signs of ADHD on formal testing (e.g., on tests of attention, vigilance, impulsivity, mental flexibility, set-shifting, inhibitory control). This does not preclude a diagnosis of ADHD but it is somewhat atypical.  The patient's test results and current level of functioning are consistent with a diagnosis of amnestic MCI. While longstanding ADHD likely contributes to his memory encoding difficulties, I also suspect that his untreated sleep apnea is playing a role. I highly recommend that he undergo repeat sleep study and begin CPAP treatment if indicated. I do not suspect that his test results are indicative of prodromal Alzheimer's disease or other neurodegenerative disease, but re-testing in one year will provide necessary test-retest comparison to  ensure there is no decline over time.   Recommendations: 1. Sleep study and CPAP treatment, if indicated,  for sleep apnea is highly recommended. The patient's untreated sleep apnea is likely contributing to his memory deficits. 2. He should continue to use external memory aids and other behavioral strategies to compensate for his memory difficulties. I reviewed strategies with him and provided written information.  3. I also would like to see him back in a year so that we can have test-retest comparison to ensure there is no memory decline over time.   Feedback to Patient: Davarious Tumbleson. and his wife returned for a feedback appointment on 01/02/2017 to review the results of his neuropsychological evaluation with this provider. 25 minutes face-to-face time was spent reviewing his test results, my impressions and my recommendations as detailed above.    Total time spent on this patient's case: [id number] unit for interview with psychologist; [phone] units of testing by psychometrician under psychologist's supervision; [phone] units for medical record review, scoring of neuropsychological tests, interpretation of test results, preparation of this report, and review of results to the patient by psychologist.      Thank you for your referral of Jeff Edwards.. Please feel free to contact me if you have any questions or concerns regarding this report.

## 2017-01-02 ENCOUNTER — Encounter: Payer: PRIVATE HEALTH INSURANCE | Admitting: Psychology

## 2017-01-02 ENCOUNTER — Encounter: Payer: Self-pay | Admitting: Psychology

## 2017-01-02 ENCOUNTER — Ambulatory Visit (INDEPENDENT_AMBULATORY_CARE_PROVIDER_SITE_OTHER): Payer: PRIVATE HEALTH INSURANCE | Admitting: Psychology

## 2017-01-02 DIAGNOSIS — R413 Other amnesia: Secondary | ICD-10-CM

## 2017-01-02 DIAGNOSIS — G3184 Mild cognitive impairment, so stated: Secondary | ICD-10-CM | POA: Diagnosis not present

## 2017-01-02 NOTE — Patient Instructions (Addendum)
Test results revealed focal deficits in your ability to learn and remember new information, especially when that information is not contextual. This could be due to longstanding ADHD. I would like to see you back in a year so that we can compare results and make sure there is no interval decline.   Strategies to enhance cognitive functioning Attention, concentration, memory encoding and consolidation    . Make a plan and be prepared o If you find that you are more attentive at certain times of the day (i.e., the morning), plan important activities and discussions at that time o Determine which activities take the most time and which are most important, then prioritize your "to do list" based on this information o Break tasks into simpler parts, understand the steps involved before starting o Rehearse the steps mentally or write them down. If you write them down, you can use this as a checklist to check off as you complete them. o Visualize completing the task  . Use external aids  o Write everything down that you do not need to know or work with right now. Don't store extra information in your brain that you don't need right now.  o Use a calendar or planner to make checklists, due dates and "to do" lists. o Use ONLY ONE calendar or planner and look at it frequently o Set alarms for important deadlines or appointments  . Minimize interruptions and distractions  o Find a good work environment, e.g., quiet room with a desk, close curtains, use earplugs, mask sounds with a fan or white noise machine o Turn off cell phone and/or email alerts during important tasks. In fact, it is helpful to schedule a block of time each day where you limit phone and email interruptions and focus on just the more detailed work you have. o Try to minimize the amount of background noise (i.e., television, music) when engaged in important tasks or conversations with others (note that some individuals find soft  background music helpful in minimize distraction, so you may need to experiment with optimal level of noise for specific situations)  . Use active effort = consciously attending to details, closely analyzing o Failures of encoding may reflect failure to attend to one's own actions o Be prepared to work more slowly than you usually do  o When reading, allow time for re-reading sections  o Check your work for errors  . Avoid multitasking o Do not attempt to complete more than one task at one time. Focus on one task until it is completed and then move on to the next one. o Avoid other activities while engaged in important tasks, such as talking on the phone while balancing the checkbook, making a shopping list during a meeting.   . Use self-talk during tasks o Repeat the steps of the activity to yourself as you complete them o Talk to yourself about your progress o This helps you maintain focus on the task and makes it easier to remember completing the task (Similar to "active effort" above)  . Conserve energy o Conserve energy to avoid fatigue and its effects on cognition - Get enough sleep - Pace yourself  and make sure to take breaks - Be open to receiving help - Exercise for increased energy  . Conversational vigilance = paying attention during a conversation  o Listen actively: focus on the speaker and position yourself so that you can clearly hear the him/her, and have open/relaxed posture  o Eye contact: Maintaining  eye contact with the person you are speaking with may increase the likelihood that important information is properly received  o Ask questions: Ask questions for clarification (e.g., request that the speaker explain something in a different way) or ask for information to be repeated if you become distracted, or if you do not hear or understand something during a conversation  o Paraphrase: Summarize or repeat back important information from a conversation in your own  words to facilitate communication and ensure that you have heard correctly and understand

## 2017-01-06 ENCOUNTER — Telehealth: Payer: Self-pay | Admitting: Neurology

## 2017-01-06 DIAGNOSIS — G4730 Sleep apnea, unspecified: Secondary | ICD-10-CM

## 2017-01-06 MED FILL — metFORMIN HCL 500 MG TABS: 500 | 30 days supply | Qty: 120 | Fill #1 | Status: TO

## 2017-01-06 NOTE — Telephone Encounter (Signed)
I have ordered sleep refer

## 2017-01-06 NOTE — Telephone Encounter (Signed)
Spoke to patient - he is aware to expect a call to schedule his sleep study.

## 2017-01-06 NOTE — Telephone Encounter (Signed)
-----   Message from Kandis Nab, [employer] sent at 01/02/2017  3:37 PM EST ----- Regarding: pt requesting sleep study Hi Dr. Krista Blue, Thank you for your kind referral of Jeff Edwards. As you will see in his report, I am concerned that his untreated sleep apnea is the culprit for worsening memory.  If you think it is indicated, he would like to know if you could go ahead and have a sleep study ordered so the diagnosis can be confirmed and he can be fitted for a CPAP.  I told him I would contact you with this request. He would like a call from your office to let him know either way. Please let me know if you have any questions. Thank you! MB

## 2017-01-27 ENCOUNTER — Encounter: Payer: Self-pay | Admitting: Neurology

## 2017-01-27 ENCOUNTER — Ambulatory Visit (INDEPENDENT_AMBULATORY_CARE_PROVIDER_SITE_OTHER): Payer: PRIVATE HEALTH INSURANCE | Admitting: Neurology

## 2017-01-27 VITALS — BP 122/72 | HR 78 | Resp 16 | Ht 68.0 in | Wt 240.0 lb

## 2017-01-27 DIAGNOSIS — R400 Somnolence: Secondary | ICD-10-CM

## 2017-01-27 DIAGNOSIS — E669 Obesity, unspecified: Secondary | ICD-10-CM | POA: Diagnosis not present

## 2017-01-27 DIAGNOSIS — G4733 Obstructive sleep apnea (adult) (pediatric): Secondary | ICD-10-CM | POA: Diagnosis not present

## 2017-01-27 DIAGNOSIS — F909 Attention-deficit hyperactivity disorder, unspecified type: Secondary | ICD-10-CM

## 2017-01-27 DIAGNOSIS — R413 Other amnesia: Secondary | ICD-10-CM

## 2017-01-27 NOTE — Patient Instructions (Signed)

## 2017-01-27 NOTE — Progress Notes (Signed)
Subjective:    Patient ID: Jeff Edwards. is a 61 y.o. male.  HPI     Star Age, MD, PhD [facility] [address] P[address]O. [place], [place] [address]  Dear [name],   I saw your patient, [name], upon your kind request in my clinic today for initial consultation of his sleep disorder, in particular reevaluation for obstructive sleep apnea. The patient is unaccompanied today. As you know, Mr. Gough is a 61 year old right-handed gentleman with an underlying medical history of ADHD, reflux disease, hypertension, hyperlipidemia, osteoarthritis, type 2 diabetes, neuropathy, basal cell cancer and memory loss, who reports a prior diagnosis of OSA some 10-15 years ago. Prior sleep study results are not available for my review. He  was previously placed on CPAP therapy and used CPAP for maybe 2 years but no longer has a machine and has not had a reevaluation in ears. He reports concentration problems, feeling tired all the time, has had weight gain, lack of energy. Epworth sleepiness score is 7 out of 24 today, fatigue score is 45 out of 63. When he was diagnosed with OSA, he tried to lose weight, and in face lost about 35 lb. CPAP was hurting his ears from the inside, and he gave up on using CPAP. He had weight gain since then. He had knee problems, had arthroscopic knee surgery in December 2016, then total knee replacement in September 2017 on the left. He is married and lives with his wife who is a Marine scientist. She notices pauses in his breathing while he is asleep. She typically sleeps in the restroom. He quit smoking over 30 years ago, smoked for 10 years, only on weekends. He drinks quite a bit of caffeine in the form of diet soda, 3 bottles per day. He does not drink tea or coffee on a regular basis. He goes to the bathroom once per average night. Bedtime is around 9 PM, he has multiple nighttime awakenings and tosses and turns. He denies morning headaches or  family history of sleep apnea. He denies restless leg symptoms. He is currently unemployed, worked as a transporter in the hospital for years. He has also worked as an Ship broker, he is also worked in [employer]. He worked night shift for the past 5 years.  His Past Medical History Is Significant For: Past Medical History:  Diagnosis Date  . ADHD (attention deficit hyperactivity disorder)   . Anemia    as a child  . 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 Basal cell carcinoma of left forearm   . Diverticulitis   . GERD (gastroesophageal reflux disease)   . High cholesterol   . Hypertension   . Memory change   . Neuropathy ([place])    in feet  . Osteoarthritis of both knees   . Retinal detachment    right eye; "no OR" (07/23/2016)  . Sleep apnea    cpap does not use (study done 10+ yrs) - has lost weight  . Type II diabetes mellitus ([place])     His Past Surgical History Is Significant For: Past Surgical History:  Procedure Laterality Date  . ANKLE FRACTURE SURGERY Right 1980s   broken and reset    . BASAL CELL CARCINOMA EXCISION Left X 2   arm  . COLONOSCOPY    . FRACTURE SURGERY    . JOINT REPLACEMENT    . KNEE ARTHROSCOPY  07/10/2012   Procedure: ARTHROSCOPY  KNEE;  Surgeon: Newt Minion, MD;  Location: [place];  Service: Orthopedics;  Laterality: Right;  Right knee arthroscopy and debridement, chrondralplasty  . KNEE ARTHROSCOPY Left 10/27/2015   Procedure: Left Knee Arthroscopy and Debridement;  Surgeon: Newt Minion, MD;  Location: [place];  Service: Orthopedics;  Laterality: Left;  . SEPTOPLASTY     x2  . TOTAL KNEE ARTHROPLASTY Left 07/23/2016  . TOTAL KNEE ARTHROPLASTY Left 07/23/2016   Procedure: LEFT TOTAL KNEE ARTHROPLASTY;  Surgeon: Melrose Nakayama, MD;  Location: [place];  Service: Orthopedics;  Laterality: Left;    His Family History Is Significant For: Family History  Problem Relation Age of Onset  . Alzheimer's disease  Mother   . High blood pressure Mother   . Other Father     Sepsis  . Dementia Father   . Heart disease Maternal Grandfather   . Diabetes Sister     His Social History Is Significant For: Social History   Social History  . Marital status: Married    Spouse name: Sula Soda  . Number of children: 0  . Years of education: college   Occupational History  . currently on leave [place] History Main Topics  . Smoking status: Former Smoker    Packs/day: 0.75    Years: 10.00    Types: Cigarettes  . Smokeless tobacco: Never Used     Comment: "quit smoking in ~ 1981/1982  . Alcohol use No  . Drug use: No  . Sexual activity: Yes   Other Topics Concern  . None   Social History Narrative   Patient lives at home with his wife Sula Soda). Patient is a transporter at [employer].    Patient has some college education.   Right handed.   Caffeine- 3 caffeine drinks a day     Three Dogs    His Allergies Are:  Allergies  Allergen Reactions  . Penicillins Other (See Comments)    SYNCOPE  :   His Current Medications Are:  Outpatient Encounter Prescriptions as of 01/27/2017  Medication Sig  . aspirin EC 325 MG EC tablet Take 1 tablet (325 mg total) by mouth 2 (two) times daily after a meal.  . atorvastatin (LIPITOR) 20 MG tablet Take 20 mg by mouth every evening.   . B Complex-C (SUPER B COMPLEX PO) Take 1 tablet by mouth daily.  . cetirizine (ZYRTEC) 10 MG tablet Take 10 mg by mouth daily.   . Flaxseed, Linseed, (FLAX SEED OIL) 1000 MG CAPS Take 1,000 mg by mouth daily.  Marland Kitchen HYDROcodone-acetaminophen (NORCO) 7.5-325 MG tablet as needed.  Marland Kitchen JANUVIA 100 MG tablet daily.  Marland Kitchen lisinopril (PRINIVIL,ZESTRIL) 5 MG tablet Take 5 mg by mouth every evening.   . Magnesium 250 MG TABS Take 250 mg by mouth daily.  . metFORMIN (GLUCOPHAGE) 500 MG tablet Take 1,000 mg by mouth 2 (two) times daily with a meal.   . Multiple Vitamin (MULTIVITAMIN WITH MINERALS)  TABS Take 1 tablet by mouth daily.  . niacin (SLO-NIACIN) 500 MG tablet Take 500 mg by mouth at bedtime.  . pantoprazole (PROTONIX) 40 MG tablet Take 40 mg by mouth daily.  . [DISCONTINUED] methocarbamol (ROBAXIN) 500 MG tablet Take 1 tablet (500 mg total) by mouth every 6 (six) hours as needed for muscle spasms.   No facility-administered encounter medications on file as of 01/27/2017.   :  Review of Systems:  Out of a complete 14  point review of systems, all are reviewed and negative with the exception of these symptoms as listed below:  Review of Systems  Neurological:       Patient states that he had a sleep study about 10-15 years ago. He was placed on CPAP but only used for 2 years.   Patient states that he snores, weight gain, daytime fatigue, decreased energy, has trouble staying asleep, witnessed apnea, wakes up feeling tired.    Epworth Sleepiness Scale 0= would never doze 1= slight chance of dozing 2= moderate chance of dozing 3= high chance of dozing  Sitting and reading:1 Watching TV:1 Sitting inactive in a public place (ex. Theater or meeting):0 As a passenger in a car for an hour without a break:1 Lying down to rest in the afternoon:3 Sitting and talking to someone:0 Sitting quietly after lunch (no alcohol):1 In a car, while stopped in traffic:0 Total:7   Objective:  Neurologic Exam  Physical Exam Physical Examination:   Vitals:   01/27/17 0847  BP: 122/72  Pulse: 78  Resp: 16    General Examination: The patient is a very pleasant 61 y.o. male in no acute distress. He appears well-developed and well-nourished and well groomed.   HEENT: Normocephalic, atraumatic, pupils are equal, round and reactive to light and accommodation. Mild bilateral cataracts. Funduscopic exam is normal with sharp disc margins noted. Extraocular tracking is good without limitation to gaze excursion or nystagmus noted. Normal smooth pursuit is noted. Hearing is grossly intact.  Face is symmetric with normal facial animation and normal facial sensation. Speech is clear with no dysarthria noted. There is no hypophonia. There is no lip, neck/head, jaw or voice tremor. Neck is supple with full range of passive and active motion. There are no carotid bruits on auscultation. Oropharynx exam reveals: mild mouth dryness, adequate dental hygiene and moderate airway crowding, due to smaller airway entry, larger uvula. Mallampati is class II. Tongue protrudes centrally and palate elevates symmetrically. Tonsils are 1+. Neck size is 17 7/8 inches. He has a Mild overbite.   Chest: Clear to auscultation without wheezing, rhonchi or crackles noted.  Heart: S1+S2+0, regular and normal without murmurs, rubs or gallops noted.   Abdomen: Soft, non-tender and non-distended with normal bowel sounds appreciated on auscultation.  Extremities: There is no pitting edema in the distal lower extremities bilaterally. Pedal pulses are intact.  Skin: Warm and dry without trophic changes noted.  Musculoskeletal: exam reveals no obvious joint deformities, tenderness or joint swelling or erythema.   Neurologically:  Mental status: The patient is awake, alert and oriented in all 4 spheres. His immediate and remote memory, attention, language skills and fund of knowledge are appropriate. There is no evidence of aphasia, agnosia, apraxia or anomia. Speech is clear with normal prosody and enunciation. Thought process is linear. Mood is normal and affect is normal.  Cranial nerves II - XII are as described above under HEENT exam. In addition: shoulder shrug is normal with equal shoulder height noted. Motor exam: Normal bulk, strength and tone is noted. There is no drift, tremor or rebound. Romberg is negative. Reflexes are 1+. Fine motor skills and coordination: intact.  Cerebellar testing: No dysmetria or intention tremor on finger to nose testing. Heel to shin is unremarkable bilaterally. There is no  truncal or gait ataxia.  Sensory exam: intact to light touch in the upper and lower extremities.  Gait, station and balance: He stands easily. No veering to one side is noted. No leaning to one  side is noted. Posture is age-appropriate and stance is narrow based. Gait shows normal stride length and normal pace. No problems turning are noted. Tandem walk is difficult for him.  Assessment and Plan:  In summary, [facility]. is a very pleasant 61 y.o.-year old male with an underlying medical history of ADHD, reflux disease, hypertension, hyperlipidemia, osteoarthritis, type 2 diabetes, neuropathy, basal cell cancer and memory loss, whose history and physical exam are in keeping with obstructive sleep apnea (OSA). I had a long chat with the patient about my findings and the diagnosis of OSA, its prognosis and treatment options. We talked about medical treatments, surgical interventions and non-pharmacological approaches. I explained in particular the risks and ramifications of untreated moderate to severe OSA, especially with respect to developing cardiovascular disease down the Road, including congestive heart failure, difficult to treat hypertension, cardiac arrhythmias, or stroke. Even type 2 diabetes has, in part, been linked to untreated OSA. Symptoms of untreated OSA include daytime sleepiness, memory problems, mood irritability and mood disorder such as depression and anxiety, lack of energy, as well as recurrent headaches, especially morning headaches. We talked about trying to maintain a healthy lifestyle in general, as well as the importance of weight control. I encouraged the patient to eat healthy, exercise daily and keep well hydrated, to keep a scheduled bedtime and wake time routine, to not skip any meals and eat healthy snacks in between meals. I advised the patient not to drive when feeling sleepy. I recommended the following at this time: sleep study with potential positive airway pressure  titration. (We will score hypopneas at 3%).   I explained the sleep test procedure to the patient and also outlined possible surgical and non-surgical treatment options of OSA. I also explained the CPAP treatment option to the patient, who indicated that he would be willing to try CPAP again if the need arises. I explained the importance of being compliant with PAP treatment, not only for insurance purposes but primarily to improve His symptoms, and for the patient's long term health benefit, including to reduce His cardiovascular risks. I answered all his questions today and the patient was in agreement. I would like to see him back after the sleep study is completed and encouraged him to call with any interim questions, concerns, problems or updates.   Thank you very much for allowing me to participate in the care of this nice patient. If I can be of any further assistance to you please do not hesitate to talk to me.  Sincerely,   Star Age, MD, PhD

## 2017-01-28 MED FILL — JANUVIA 100 MG TABLET: 100 | 30 days supply | Qty: 30 | Fill #1 | Status: TO

## 2017-01-28 MED FILL — PANTOPRAZOLE SOD DR 40 MG T: 40 | 30 days supply | Qty: 30 | Fill #0 | Status: TO

## 2017-01-28 MED FILL — ATORVASTATIN 20 MG TABLET: 20 | 30 days supply | Qty: 30 | Fill #1 | Status: TO

## 2017-02-03 ENCOUNTER — Encounter: Payer: Self-pay | Admitting: Neurology

## 2017-02-03 ENCOUNTER — Ambulatory Visit (INDEPENDENT_AMBULATORY_CARE_PROVIDER_SITE_OTHER): Payer: PRIVATE HEALTH INSURANCE | Admitting: Neurology

## 2017-02-03 VITALS — BP 122/72 | HR 83 | Ht 68.0 in | Wt 241.0 lb

## 2017-02-03 DIAGNOSIS — R413 Other amnesia: Secondary | ICD-10-CM

## 2017-02-03 DIAGNOSIS — G4733 Obstructive sleep apnea (adult) (pediatric): Secondary | ICD-10-CM

## 2017-02-03 DIAGNOSIS — G629 Polyneuropathy, unspecified: Secondary | ICD-10-CM | POA: Insufficient documentation

## 2017-02-03 DIAGNOSIS — G63 Polyneuropathy in diseases classified elsewhere: Secondary | ICD-10-CM

## 2017-02-03 DIAGNOSIS — R102 Pelvic and perineal pain: Secondary | ICD-10-CM | POA: Insufficient documentation

## 2017-02-03 NOTE — Progress Notes (Signed)
PATIENT: Jeff Edwards. DOB: 04/14/1956  Chief Complaint  Patient presents with  . ADD/Memory Loss    MMSE 28/30 - 16 animals.  Feels his attention deficit and memory loss are unchanged.  . R/O Sleep Apnea    He is waiting to be scheduled for a sleep study.     HISTORICAL  Jeff Edwards. is a 61 years old right-handed male seen in refer by his primary care doctor Jeff Edwards for evaluation of difficulty with memory, initial evaluation was November 04 2016.  He was a patient of Jeff Edwards in 2013, for similar complaints, difficulty focusing, mild depression, difficulty. Attention to details, per record, MRI of the brain showed mild supratentorium Jeff Edwards vessel disease, laboratory evaluation showed no significant abnormality, CMP, TSH, vitamin D,  He was referred for neuropsychiatric evaluation by Jeff Edwards in September 2013, indicative of ADHD, he has been treated methylphenidate 5 mg with good result, Mini-Mental Status then was 28/30, patient reported higher dose of methylphenidate 10 mg make him have angry argument with his wife.  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  He now complains of worsening memory trouble, has moment of forgetting direction while driving sometimes, no loss of consciousness, misplaced things, forget cooking recipes.  I reviewed laboratory evaluation A1c 8.3 in August 2017, cbc, bmp glucose 162.  UPDATE February 03 2017: I reviewed and summarized his neuropsychiatric evaluation on January 02 2017 by Jeff Edwards, current test, largely unsupportive of ADHD, his only deficit at most recent testing are in  no-n texture verbal memory, he does not show any other signs of ADHD on formal testing,  His test result and current level of functioning at consistent with a diagnosis of amnestic mild cognitive impairment, his untreated obstructive sleep apnea likely play a role, there was no indication that his test result are indicative of prodrome of Alzheimer's disease or other neurodegenerative disease, but Jeff Edwards suggested retesting in one year  I reviewed laboratory evaluation in December 2017, normal ESR, vitamin R83, RPR, folic acid. C reactive protein, thyroid functional tests.  He was treated with Ritalin 9m bid initially, while on higher dose 175mbid, he complains of anger control problem and moodiness.   Last A1C 8.5, He has been out of work due to left knee replacement in 2017.  He has right knee pain,   We have personally reviewed MRI of brain in December 2017, slight atrophy, supratentorium Jeff Edwards vessel disease, no acute abnormality.  He does endorse the symptoms of obstructive sleep apnea, excessive snoring, daytime sleepiness, fatigue, lack of stamina  REVIEW OF SYSTEMS: Full 14 system review of systems performed and notable only for as above  ALLERGIES: Allergies  Allergen Reactions  . Penicillins Other (See Comments)    SYNCOPE    HOME MEDICATIONS: Current Outpatient Prescriptions  Medication Sig Dispense Refill  . aspirin EC 325 MG EC tablet Take 1 tablet (325 mg total) by mouth 2 (two) times daily after a meal. 30 tablet 0  . atorvastatin (LIPITOR) 20 MG tablet Take 20 mg by mouth every evening.     . B Complex-C (SUPER B COMPLEX PO) Take 1  tablet by mouth daily.    . cetirizine (ZYRTEC) 10 MG tablet Take 10 mg by mouth daily.     . Flaxseed, Linseed, (FLAX SEED OIL) 1000 MG CAPS Take 1,000 mg by mouth daily.    Marland Kitchen HYDROcodone-acetaminophen (NORCO) 7.5-325 MG tablet as needed.  0  . JANUVIA 100 MG tablet daily.  12  . lisinopril (PRINIVIL,ZESTRIL) 5 MG tablet Take 5  mg by mouth every evening.     . Magnesium 250 MG TABS Take 250 mg by mouth daily.    . metFORMIN (GLUCOPHAGE) 500 MG tablet Take 1,000 mg by mouth 2 (two) times daily with a meal.     . Multiple Vitamin (MULTIVITAMIN WITH MINERALS) TABS Take 1 tablet by mouth daily.    . niacin (SLO-NIACIN) 500 MG tablet Take 500 mg by mouth at bedtime.    . pantoprazole (PROTONIX) 40 MG tablet Take 40 mg by mouth daily.     No current facility-administered medications for this visit.     PAST MEDICAL HISTORY: Past Medical History:  Diagnosis Date  . ADHD (attention deficit hyperactivity disorder)   . Anemia    as a child  . 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 Basal cell carcinoma of left forearm   . Diverticulitis   . GERD (gastroesophageal reflux disease)   . High cholesterol   . Hypertension   . Memory change   . Neuropathy (Jeff Edwards)    in feet  . Osteoarthritis of both knees   . Retinal detachment    right eye; "no OR" (07/23/2016)  . Sleep apnea    cpap does not use (study done 10+ yrs) - has lost weight  . Type II diabetes mellitus ([place])     PAST SURGICAL HISTORY: Past Surgical History:  Procedure Laterality Date  . ANKLE FRACTURE SURGERY Right 1980s   broken and reset    . BASAL CELL CARCINOMA EXCISION Left X 2   arm  . COLONOSCOPY    . FRACTURE SURGERY    . JOINT REPLACEMENT    . KNEE ARTHROSCOPY  07/10/2012   Procedure: ARTHROSCOPY KNEE;  Surgeon: Jeff Minion, MD;  Location: [facility];  Service: Orthopedics;  Laterality: Right;  Right knee arthroscopy and debridement, chrondralplasty  . KNEE ARTHROSCOPY Left 10/27/2015   Procedure: Left Knee Arthroscopy and Debridement;  Surgeon: Jeff Minion, MD;  Location: [place];  Service: Orthopedics;  Laterality: Left;  . SEPTOPLASTY     x2  . TOTAL KNEE ARTHROPLASTY Left 07/23/2016  . TOTAL KNEE ARTHROPLASTY Left 07/23/2016   Procedure: LEFT TOTAL KNEE ARTHROPLASTY;  Surgeon: Jeff Nakayama, MD;  Location: [place];   Service: Orthopedics;  Laterality: Left;    FAMILY HISTORY: Family History  Problem Relation Age of Onset  . Alzheimer's disease Mother   . High blood pressure Mother   . Other Father     Sepsis  . Dementia Father   . Heart disease Maternal Grandfather   . Diabetes Sister     SOCIAL HISTORY:  Social History   Social History  . Marital status: Married    Spouse name: Sula Soda  . Number of children: 0  . Years of education: college   Occupational History  . currently on leave [place] History Main Topics  . Smoking status: Former Smoker    Packs/day: 0.75    Years: 10.00    Types: Cigarettes  . Smokeless tobacco: Never  Used     Comment: "quit smoking in ~ 1981/1982  . Alcohol use No  . Drug use: No  . Sexual activity: Yes   Other Topics Concern  . Not on file   Social History Narrative   Patient lives at home with his wife Sula Soda). 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    Patient has some college education.   Right handed.   Caffeine- 3 caffeine drinks a day     Three Dogs     PHYSICAL EXAM   Vitals:   02/03/17 0824  BP: 122/72  Pulse: 83  Weight: 241 lb (109.3 kg)  Height: _0  (1.727 m)    Not recorded      Body mass index is 36.64 kg/m.  PHYSICAL EXAMNIATION:  Gen: NAD, conversant, well nourised, obese, well groomed                     Cardiovascular: Regular rate rhythm, no peripheral edema, warm, nontender. Eyes: Conjunctivae clear without exudates or hemorrhage Neck: Supple, no carotid bruits. Pulmonary: Clear to auscultation bilaterally   NEUROLOGICAL EXAM:  MENTAL STATUS: Speech:    Speech is normal; fluent and spontaneous with normal comprehension.  Cognition:Mini-Mental Status Examination 28/30, animal naming 16     Orientation to time, place and person     Recent and remote memory: He missed a 2/3 recalls     Normal Attention span and concentration     Normal Language, naming,  repeating,spontaneous speech     Fund of knowledge   CRANIAL NERVES: CN II: Visual fields are full to confrontation. Fundoscopic exam is normal with sharp discs and no vascular changes. Pupils are round equal and briskly reactive to light. CN III, IV, VI: extraocular movement are normal. No ptosis. CN V: Facial sensation is intact to pinprick in all 3 divisions bilaterally. Corneal responses are intact.  CN VII: Face is symmetric with normal eye closure and smile. CN VIII: Hearing is normal to rubbing fingers CN IX, X: Palate elevates symmetrically. Phonation is normal. CN XI: Head turning and shoulder shrug are intact CN XII: Tongue is midline with normal movements and no atrophy.  MOTOR: There is no pronator drift of out-stretched arms. Muscle bulk and tone are normal. Muscle strength is normal.  REFLEXES: Reflexes are 2+ and symmetric at the biceps, triceps, knees, and ankles. Plantar responses are flexor.  SENSORY: Intact to light touch, pinprick, positional sensation and vibratory sensation are intact in fingers and toes.  COORDINATION: Rapid alternating movements and fine finger movements are intact. There is no dysmetria on finger-to-nose and heel-knee-shin.    GAIT/STANCE: Posture is normal. Gait is steady with normal steps, base, arm swing, and turning. Heel and toe walking are normal. Tandem gait is normal.  Romberg is absent.   DIAGNOSTIC DATA (LABS, IMAGING, TESTING) - I reviewed patient records, labs, notes, testing and imaging myself where available.   ASSESSMENT AND PLAN  Jeff Edwards. is a 61 y.o. male   Mild cognitive impairment  MRI of the brain showed mild age appropriate atrophy, mild supratentorium [place] vessel disease  Laboratory showed no treatable etiology  Evidence of obstructive sleep apnea, sleep study pending.   Marcial Pacas, M.D. Ph.D.  [facility] [address] [place], [place] [address] Ph: [phone] Fax:  [phone]  CC: Referring Provider

## 2017-02-04 ENCOUNTER — Telehealth: Payer: Self-pay | Admitting: Psychology

## 2017-02-04 NOTE — Telephone Encounter (Signed)
-----   Message from Elenora Fender sent at 02/03/2017 12:00 PM EDT ----- Regarding: Test Results Contact: [phone] Hi Dr. Flossie Buffy Easton called and had a question for you regarding his test results. He would like you to please call him.  Thank you Theadora Rama

## 2017-02-04 NOTE — Progress Notes (Signed)
Spoke with patient via phone. Mr. Herd wanted to clarify that he correctly understood the results of his neuropsychological evaluation which were reviewed with him by me on 01/02/2017. He reported that at recent visits with [name] and Dr. Krista Blue, he was told he did not have ADHD, and he wondered if my report in his chart accurately reflected his diagnosis. I advised him (as I had done at our appointment on 2/15) that while the current testing wasn't in line with what we typically see in ADHD, that he may still have it and it may be contributing to cognitive symptoms to some extent. I advised that I felt that sleep apnea, if present, is a larger issue that needs to be addressed. I read the following section from the report to him: Clinical Impressions: Amnestic mild cognitive impairment, single domain. ADHD (by history). Mr. Lynch was previously diagnosed with ADHD by Dr. Valentina Shaggy. Unfortunately these records are unavailable for my review, so no test-retest comparison can be made. It is surprising that current test results are largely unsupportive of ADHD. His only deficits on current testing are in non-contextual verbal memory. While reduced memory encoding is common in ADHD, it is surprising that he does not show any other signs of ADHD on formal testing (e.g., on tests of attention, vigilance, impulsivity, mental flexibility, set-shifting, inhibitory control). This does not preclude a diagnosis of ADHD but it is somewhat atypical.  The patient's test results and current level of functioning are consistent with a diagnosis of amnestic MCI. While longstanding ADHD likely contributes to his memory encoding difficulties, I also suspect that his untreated sleep apnea is playing a role. I highly recommend that he undergo repeat sleep study and begin CPAP treatment if indicated.   The patient requested that I clarify this information with his providers ([name] and Dr. Krista Blue) to make sure there is accurate  communication and coordination of care. I will gladly do this.

## 2017-02-10 MED FILL — LISINOPRIL 5 MG TABLET: 5 | 30 days supply | Qty: 30 | Fill #1 | Status: TO

## 2017-02-10 MED FILL — metFORMIN HCL 500 MG TABS: 500 | 30 days supply | Qty: 120 | Fill #2 | Status: TO

## 2017-02-18 ENCOUNTER — Telehealth: Payer: Self-pay

## 2017-02-18 NOTE — Telephone Encounter (Signed)
Pt declined to schedule sleep study and stated he couldn't afford it.

## 2017-03-04 ENCOUNTER — Other Ambulatory Visit (HOSPITAL_BASED_OUTPATIENT_CLINIC_OR_DEPARTMENT_OTHER): Payer: Self-pay

## 2017-03-04 DIAGNOSIS — G4730 Sleep apnea, unspecified: Secondary | ICD-10-CM

## 2017-04-02 ENCOUNTER — Ambulatory Visit (HOSPITAL_BASED_OUTPATIENT_CLINIC_OR_DEPARTMENT_OTHER): Payer: PRIVATE HEALTH INSURANCE | Attending: Otolaryngology

## 2017-05-14 ENCOUNTER — Ambulatory Visit (HOSPITAL_BASED_OUTPATIENT_CLINIC_OR_DEPARTMENT_OTHER): Payer: PRIVATE HEALTH INSURANCE | Attending: Otolaryngology | Admitting: Internal Medicine

## 2017-05-14 VITALS — Ht 70.0 in | Wt 229.0 lb

## 2017-05-14 DIAGNOSIS — G4733 Obstructive sleep apnea (adult) (pediatric): Secondary | ICD-10-CM | POA: Insufficient documentation

## 2017-05-14 DIAGNOSIS — R0902 Hypoxemia: Secondary | ICD-10-CM | POA: Diagnosis not present

## 2017-05-14 DIAGNOSIS — G4730 Sleep apnea, unspecified: Secondary | ICD-10-CM | POA: Diagnosis present

## 2017-05-21 DIAGNOSIS — G4730 Sleep apnea, unspecified: Secondary | ICD-10-CM

## 2017-05-21 NOTE — Procedures (Signed)
   Patient Name: Jeff Edwards, Jeff Edwards Date: 05/14/2017 Gender: Male D.O.B: [birth year] Age (years): 60 Referring Provider: Jodi Marble Height (inches): 65 Interpreting Physician: Baird Lyons MD, ABSM Weight (lbs): 229 RPSGT: Jacolyn Reedy BMI: 33 MRN: [id number] Neck Size: 17.50 CLINICAL INFORMATION Sleep Study Type: unattended HST  Indication for sleep study: Snoring  Epworth Sleepiness Score: 6  SLEEP STUDY TECHNIQUE A multi-channel overnight portable sleep study was performed. The channels recorded were: nasal airflow, thoracic respiratory movement, and oxygen saturation with a pulse oximetry. Snoring was also monitored.  MEDICATIONS Patient self administered medications include: none reported during study.  SLEEP ARCHITECTURE Patient was studied for 461.0 minutes. The sleep efficiency was 99.2 % and the patient was supine for 8.4%. The arousal index was 0.0 per hour.  RESPIRATORY PARAMETERS The overall AHI was 21.6 per hour, with a central apnea index of 0.0 per hour.  The oxygen nadir was 85% during sleep.  CARDIAC DATA Mean heart rate during sleep was 75.2 bpm.  IMPRESSIONS - Moderate obstructive sleep apnea occurred during this study (AHI = 21.6/h). - No significant central sleep apnea occurred during this study (CAI = 0.0/h). - Moderate oxygen desaturation was noted during this study (Min O2 = 85%, Mean  91%). - Patient snored   DIAGNOSIS - Obstructive Sleep Apnea (327.23 [G47.33 ICD-10]) - Nocturnal Hypoxemia (327.26 [G47.36 ICD-10])  RECOMMENDATIONS - Suggest CPAP titration to establish therapy. Other options based on clinical judgment. - Positional therapy avoiding supine position during sleep. - Be careful with alcohol, sedatives and other CNS depressants that may worsen sleep apnea and disrupt normal sleep architecture. - Sleep hygiene should be reviewed to assess factors that may improve sleep quality. - Weight management and regular  exercise should be initiated or continued.y  [Electronically signed] 05/21/2017 10:08 AM  Baird Lyons MD, ABSM Diplomate, American Board of Sleep Medicine   NPI: [id number]  [place]  ELECTRONICALLY SIGNED ON:  05/21/2017, 10:04 AM [place] PH: [phone]   FX: [phone] [place]

## 2017-10-19 IMAGING — MR MR HEAD W/O CM
10 series · 48 of 48 positions shown · non-contrast
Comparison: No previous imaging studies of the brain are present on
the electronic timeline.

CLINICAL DATA: Patient presenting with mild cognitive impairment.
Long history of ADHD. History of diabetes mellitus type 2,
hypertension, and hypercholesterolemia.

EXAM:
MRI HEAD WITHOUT CONTRAST
TECHNIQUE: Multiplanar, multiecho pulse sequences of the brain and surrounding
structures were obtained without intravenous contrast.

[Series 5: T1 · sagittal · 4.0mm · 0.75mm/px · 3 of 31 slices shown (1 of 2)]
[im 1/31]
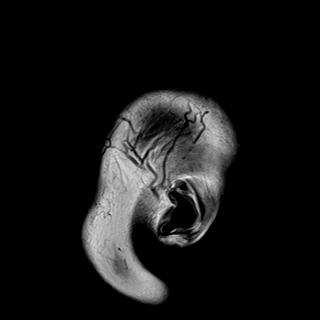
[im 16/31]
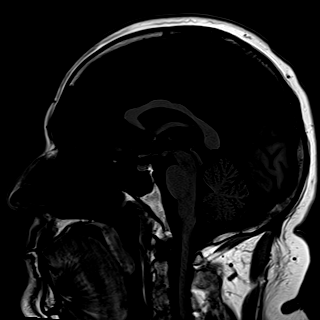
[im 31/31]
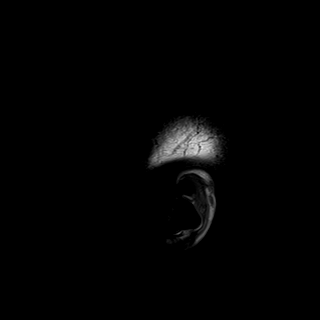

[Series 6: DWI · axial · 3.0mm · 1.44mm/px · z∈[-54,+101]mm · 9 of 100 slices shown (1 of 4)]
[im 1/100]
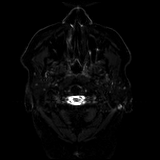
[im 13/100]
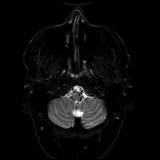
[im 25/100]
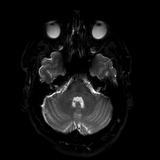
[im 38/100]
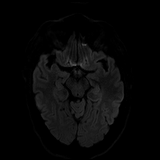
[im 50/100]
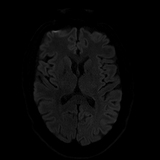
[im 62/100]
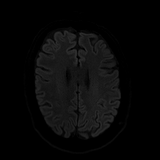
[im 75/100]
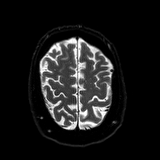
[im 87/100]
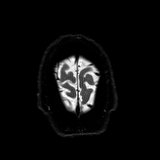
[im 100/100]
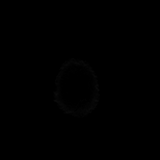

[Series 7: DWI · axial · 3.0mm · 1.44mm/px · z∈[-54,+101]mm · 4 of 50 slices shown (2 of 4)]
[im 1/50]
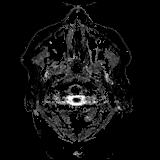
[im 17/50]
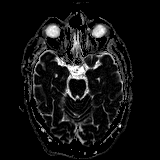
[im 33/50]
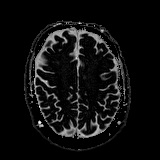
[im 50/50]
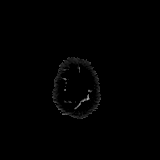

[Series 8: DWI · coronal · 5.0mm · 1.44mm/px · 5 of 64 slices shown (3 of 4)]
[im 1/64]
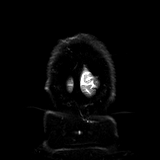
[im 16/64]
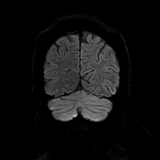
[im 32/64]
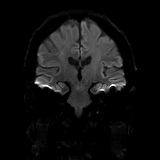
[im 48/64]
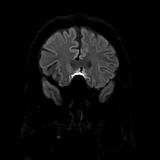
[im 64/64]
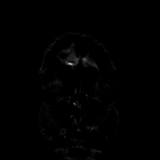

[Series 9: DWI · coronal · 5.0mm · 1.44mm/px · 2 of 32 slices shown (4 of 4)]
[im 1/32]
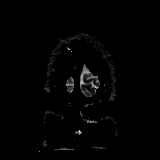
[im 32/32]
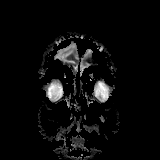

[Series 10: T2 · axial · 4.0mm · 0.36mm/px · z∈[-52,+92]mm · 2 of 30 slices shown (1 of 2)]
[im 1/30]
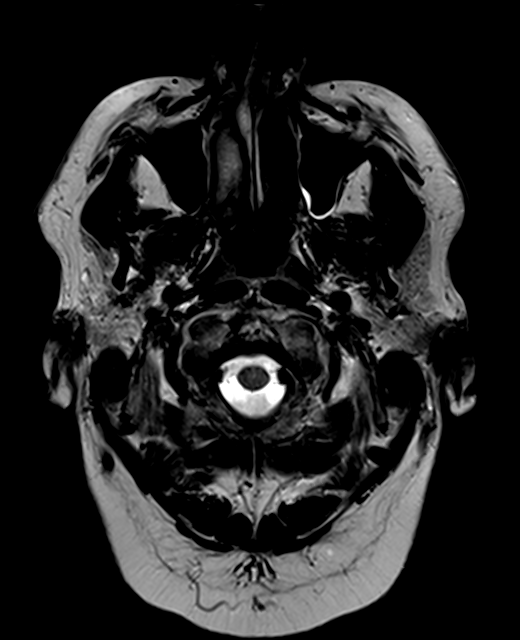
[im 30/30]
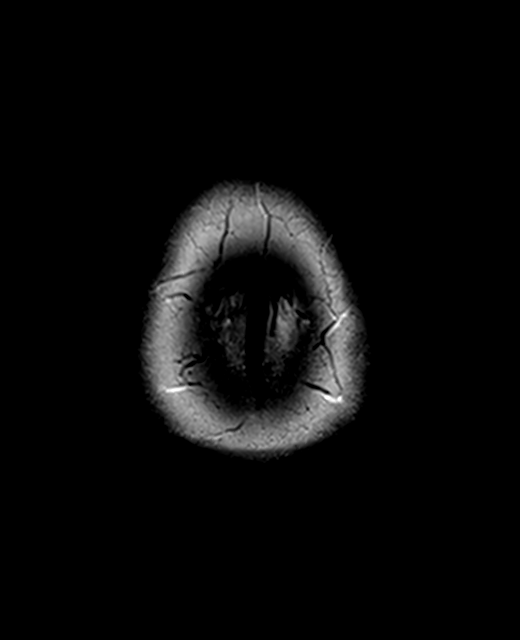

[Series 11: FLAIR · axial · 4.0mm · 0.72mm/px · z∈[-53,+92]mm · 2 of 30 slices shown]
[im 1/30]
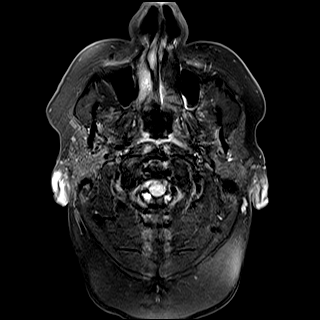
[im 30/30]
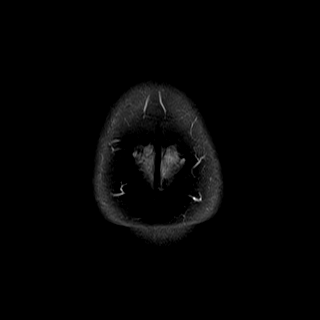

[Series 13: swi_images · axial · 1.5mm · 0.90mm/px · z∈[-48,+88]mm · 7 of 96 slices shown]
[im 1/96]
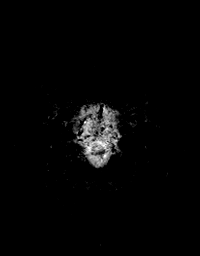
[im 16/96]
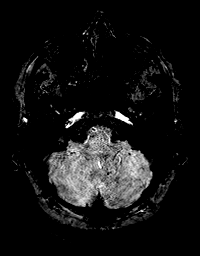
[im 32/96]
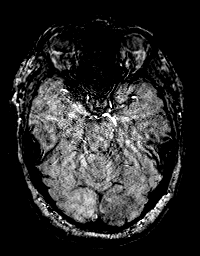
[im 48/96]
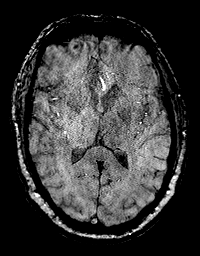
[im 64/96]
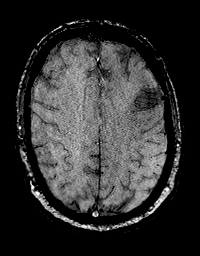
[im 80/96]
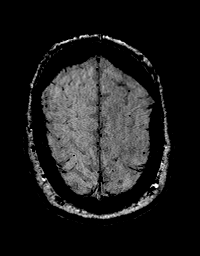
[im 96/96]
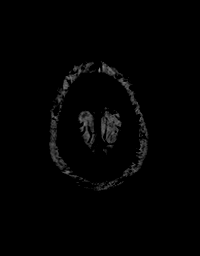

[Series 14: T1 · axial · 1.0mm · 0.90mm/px · z∈[-56,+96]mm · 12 of 160 slices shown (2 of 2)]
[im 1/160]
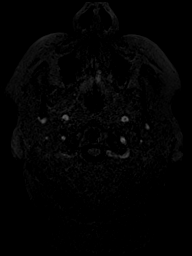
[im 15/160]
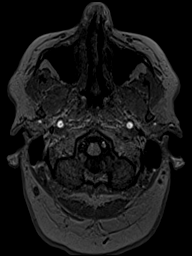
[im 29/160]
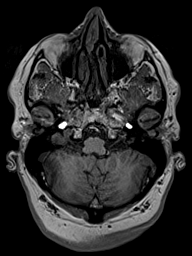
[im 44/160]
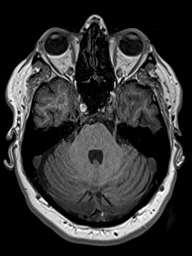
[im 58/160]
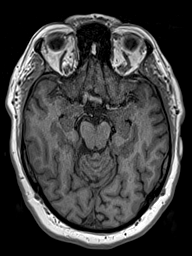
[im 73/160]
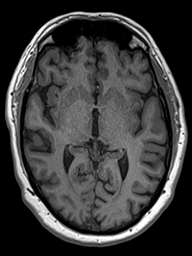
[im 87/160]
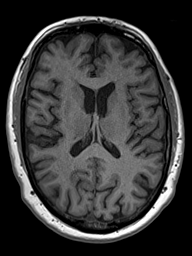
[im 102/160]
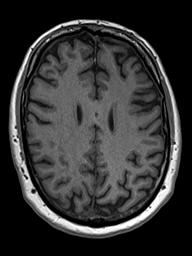
[im 116/160]
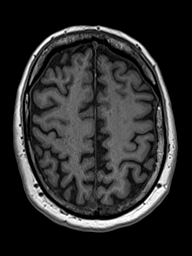
[im 131/160]
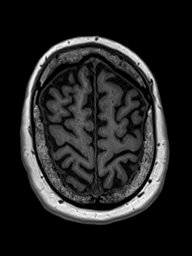
[im 145/160]
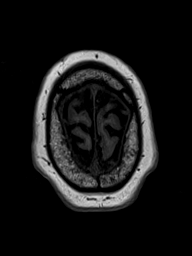
[im 160/160]
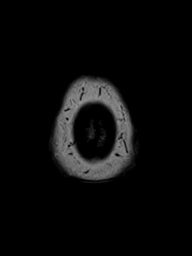

[Series 15: T2 · coronal · 4.5mm · 0.36mm/px · 2 of 30 slices shown (2 of 2)]
[im 1/30]
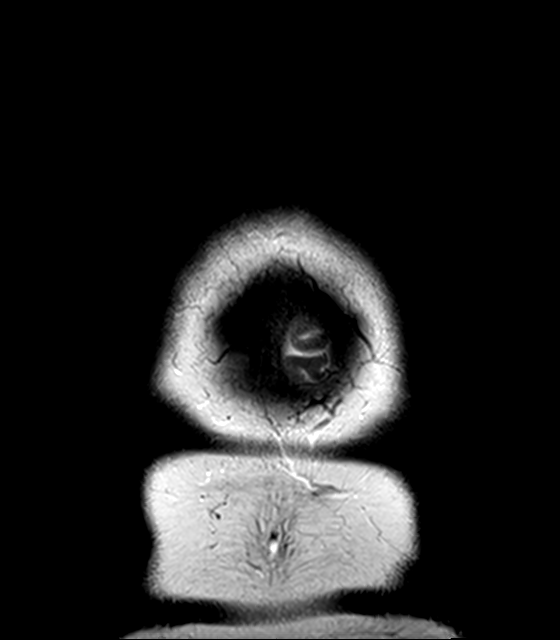
[im 30/30]
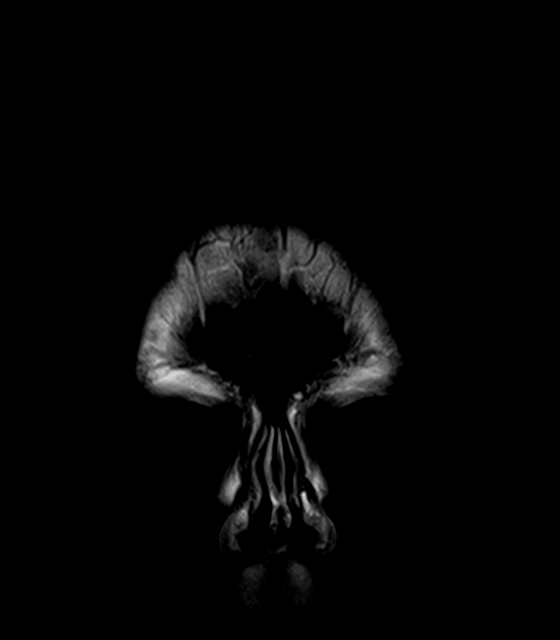

[48 of 48 positions shown; findings below may reference images not displayed]

[REDACTED] note from ordering provider reports
that MRI of the brain in 6722 showed mild supratentorial small
vessel disease.
FINDINGS: Brain: No evidence for acute infarction, hemorrhage, mass lesion,
hydrocephalus, or extra-axial fluid. Normal for age cerebral volume.

Small, relatively symmetric, subcortical greater than
periventricular T2 and FLAIR hyperintensities most consistent with
chronic microvascular ischemic change in this patient with diabetes,
hypertension, and elevated cholesterol. Other considerations would
include chronic infection, vasculitis, or complicated migraine, all
less favored.

Vascular: Flow voids are maintained throughout the carotid, basilar,
and vertebral arteries. There are no areas of chronic hemorrhage.

Skull and upper cervical spine: Unremarkable visualized calvarium,
skullbase, and cervical vertebrae. Pituitary, pineal, cerebellar
tonsils unremarkable. No upper cervical cord lesions.

Sinuses/Orbits: No orbital masses or proptosis. Globes appear
symmetric. Sinuses appear well aerated, without evidence for
air-fluid level.

Other: No significant mastoid fluid.  Unremarkable nasopharynx.
IMPRESSION: Normal for age cerebral volume. Mild supratentorial white matter
signal abnormality, previously described, favored to represent
chronic microvascular ischemic change.

No acute intracranial abnormality.

## 2017-10-29 ENCOUNTER — Ambulatory Visit (INDEPENDENT_AMBULATORY_CARE_PROVIDER_SITE_OTHER): Payer: 59 | Admitting: Ophthalmology

## 2017-10-29 ENCOUNTER — Encounter (INDEPENDENT_AMBULATORY_CARE_PROVIDER_SITE_OTHER): Payer: PRIVATE HEALTH INSURANCE | Admitting: Ophthalmology

## 2017-12-01 ENCOUNTER — Encounter (INDEPENDENT_AMBULATORY_CARE_PROVIDER_SITE_OTHER): Payer: BLUE CROSS/BLUE SHIELD | Admitting: Ophthalmology

## 2017-12-01 ENCOUNTER — Encounter (INDEPENDENT_AMBULATORY_CARE_PROVIDER_SITE_OTHER): Payer: PRIVATE HEALTH INSURANCE | Admitting: Ophthalmology

## 2017-12-01 DIAGNOSIS — H43813 Vitreous degeneration, bilateral: Secondary | ICD-10-CM

## 2017-12-01 DIAGNOSIS — I10 Essential (primary) hypertension: Secondary | ICD-10-CM

## 2017-12-01 DIAGNOSIS — H35033 Hypertensive retinopathy, bilateral: Secondary | ICD-10-CM | POA: Diagnosis not present

## 2017-12-01 DIAGNOSIS — H35713 Central serous chorioretinopathy, bilateral: Secondary | ICD-10-CM

## 2017-12-01 DIAGNOSIS — H2513 Age-related nuclear cataract, bilateral: Secondary | ICD-10-CM

## 2018-12-07 ENCOUNTER — Encounter (INDEPENDENT_AMBULATORY_CARE_PROVIDER_SITE_OTHER): Payer: BLUE CROSS/BLUE SHIELD | Admitting: Ophthalmology

## 2018-12-21 ENCOUNTER — Encounter (INDEPENDENT_AMBULATORY_CARE_PROVIDER_SITE_OTHER): Payer: BLUE CROSS/BLUE SHIELD | Admitting: Ophthalmology

## 2018-12-28 ENCOUNTER — Encounter (INDEPENDENT_AMBULATORY_CARE_PROVIDER_SITE_OTHER): Payer: Managed Care, Other (non HMO) | Admitting: Ophthalmology

## 2018-12-28 DIAGNOSIS — H35033 Hypertensive retinopathy, bilateral: Secondary | ICD-10-CM

## 2018-12-28 DIAGNOSIS — H35713 Central serous chorioretinopathy, bilateral: Secondary | ICD-10-CM

## 2018-12-28 DIAGNOSIS — I10 Essential (primary) hypertension: Secondary | ICD-10-CM

## 2018-12-28 DIAGNOSIS — H43813 Vitreous degeneration, bilateral: Secondary | ICD-10-CM | POA: Diagnosis not present

## 2018-12-28 DIAGNOSIS — H2513 Age-related nuclear cataract, bilateral: Secondary | ICD-10-CM

## 2019-08-09 ENCOUNTER — Other Ambulatory Visit: Payer: Self-pay

## 2019-08-09 ENCOUNTER — Encounter (INDEPENDENT_AMBULATORY_CARE_PROVIDER_SITE_OTHER): Payer: Managed Care, Other (non HMO) | Admitting: Ophthalmology

## 2019-08-09 DIAGNOSIS — E113393 Type 2 diabetes mellitus with moderate nonproliferative diabetic retinopathy without macular edema, bilateral: Secondary | ICD-10-CM

## 2019-08-09 DIAGNOSIS — H35713 Central serous chorioretinopathy, bilateral: Secondary | ICD-10-CM | POA: Diagnosis not present

## 2019-08-09 DIAGNOSIS — I10 Essential (primary) hypertension: Secondary | ICD-10-CM

## 2019-08-09 DIAGNOSIS — E11319 Type 2 diabetes mellitus with unspecified diabetic retinopathy without macular edema: Secondary | ICD-10-CM | POA: Diagnosis not present

## 2019-08-09 DIAGNOSIS — H43813 Vitreous degeneration, bilateral: Secondary | ICD-10-CM

## 2019-08-09 DIAGNOSIS — H35033 Hypertensive retinopathy, bilateral: Secondary | ICD-10-CM

## 2019-12-29 ENCOUNTER — Encounter (INDEPENDENT_AMBULATORY_CARE_PROVIDER_SITE_OTHER): Payer: Managed Care, Other (non HMO) | Admitting: Ophthalmology

## 2019-12-31 ENCOUNTER — Encounter (INDEPENDENT_AMBULATORY_CARE_PROVIDER_SITE_OTHER): Payer: Managed Care, Other (non HMO) | Admitting: Ophthalmology

## 2020-01-20 ENCOUNTER — Encounter (INDEPENDENT_AMBULATORY_CARE_PROVIDER_SITE_OTHER): Payer: Managed Care, Other (non HMO) | Admitting: Ophthalmology

## 2020-01-20 DIAGNOSIS — H2513 Age-related nuclear cataract, bilateral: Secondary | ICD-10-CM

## 2020-01-20 DIAGNOSIS — E11319 Type 2 diabetes mellitus with unspecified diabetic retinopathy without macular edema: Secondary | ICD-10-CM

## 2020-01-20 DIAGNOSIS — H35033 Hypertensive retinopathy, bilateral: Secondary | ICD-10-CM

## 2020-01-20 DIAGNOSIS — I10 Essential (primary) hypertension: Secondary | ICD-10-CM | POA: Diagnosis not present

## 2020-01-20 DIAGNOSIS — H35713 Central serous chorioretinopathy, bilateral: Secondary | ICD-10-CM

## 2020-01-20 DIAGNOSIS — E113293 Type 2 diabetes mellitus with mild nonproliferative diabetic retinopathy without macular edema, bilateral: Secondary | ICD-10-CM | POA: Diagnosis not present

## 2020-01-20 DIAGNOSIS — H43813 Vitreous degeneration, bilateral: Secondary | ICD-10-CM

## 2020-08-09 ENCOUNTER — Encounter (INDEPENDENT_AMBULATORY_CARE_PROVIDER_SITE_OTHER): Payer: Managed Care, Other (non HMO) | Admitting: Ophthalmology
